# Patient Record
Sex: Male | Born: 1935 | Race: White | Hispanic: No | Smoking: Former smoker
Health system: Southern US, Community
[De-identification: ages and names within clinical notes are randomized; demographics above are authoritative.]

## PROBLEM LIST (undated history)

## (undated) ENCOUNTER — Emergency Department (HOSPITAL_COMMUNITY): Payer: Medicare Other | Source: Home / Self Care

## (undated) DIAGNOSIS — I444 Left anterior fascicular block: Secondary | ICD-10-CM

## (undated) DIAGNOSIS — H3530 Unspecified macular degeneration: Secondary | ICD-10-CM

## (undated) DIAGNOSIS — R531 Weakness: Secondary | ICD-10-CM

## (undated) DIAGNOSIS — Z8546 Personal history of malignant neoplasm of prostate: Secondary | ICD-10-CM

## (undated) DIAGNOSIS — K219 Gastro-esophageal reflux disease without esophagitis: Secondary | ICD-10-CM

## (undated) DIAGNOSIS — G309 Alzheimer's disease, unspecified: Secondary | ICD-10-CM

## (undated) DIAGNOSIS — E785 Hyperlipidemia, unspecified: Secondary | ICD-10-CM

## (undated) DIAGNOSIS — K56609 Unspecified intestinal obstruction, unspecified as to partial versus complete obstruction: Secondary | ICD-10-CM

## (undated) DIAGNOSIS — Z8719 Personal history of other diseases of the digestive system: Secondary | ICD-10-CM

## (undated) DIAGNOSIS — M810 Age-related osteoporosis without current pathological fracture: Secondary | ICD-10-CM

## (undated) DIAGNOSIS — R296 Repeated falls: Secondary | ICD-10-CM

## (undated) DIAGNOSIS — G301 Alzheimer's disease with late onset: Secondary | ICD-10-CM

## (undated) DIAGNOSIS — C801 Malignant (primary) neoplasm, unspecified: Secondary | ICD-10-CM

## (undated) DIAGNOSIS — F0280 Dementia in other diseases classified elsewhere without behavioral disturbance: Secondary | ICD-10-CM

## (undated) DIAGNOSIS — I10 Essential (primary) hypertension: Secondary | ICD-10-CM

## (undated) DIAGNOSIS — K625 Hemorrhage of anus and rectum: Secondary | ICD-10-CM

## (undated) DIAGNOSIS — M48061 Spinal stenosis, lumbar region without neurogenic claudication: Secondary | ICD-10-CM

## (undated) DIAGNOSIS — E039 Hypothyroidism, unspecified: Secondary | ICD-10-CM

## (undated) DIAGNOSIS — E079 Disorder of thyroid, unspecified: Secondary | ICD-10-CM

## (undated) HISTORY — PX: PROSTATECTOMY: SHX69

## (undated) HISTORY — DX: Gastro-esophageal reflux disease without esophagitis: K21.9

## (undated) HISTORY — PX: EYE SURGERY: SHX253

## (undated) HISTORY — DX: Essential (primary) hypertension: I10

## (undated) HISTORY — DX: Disorder of thyroid, unspecified: E07.9

## (undated) HISTORY — DX: Hyperlipidemia, unspecified: E78.5

## (undated) HISTORY — PX: OTHER SURGICAL HISTORY: SHX169

---

## 2013-11-17 DIAGNOSIS — Z8719 Personal history of other diseases of the digestive system: Secondary | ICD-10-CM

## 2013-11-17 DIAGNOSIS — K56609 Unspecified intestinal obstruction, unspecified as to partial versus complete obstruction: Secondary | ICD-10-CM

## 2013-11-17 HISTORY — DX: Unspecified intestinal obstruction, unspecified as to partial versus complete obstruction: K56.609

## 2013-11-17 HISTORY — DX: Personal history of other diseases of the digestive system: Z87.19

## 2014-08-08 DIAGNOSIS — R296 Repeated falls: Secondary | ICD-10-CM

## 2014-08-08 HISTORY — DX: Repeated falls: R29.6

## 2014-10-29 ENCOUNTER — Encounter: Payer: Self-pay | Admitting: Neurology

## 2014-11-01 ENCOUNTER — Other Ambulatory Visit: Payer: Self-pay | Admitting: *Deleted

## 2014-11-01 ENCOUNTER — Encounter: Payer: Self-pay | Admitting: Neurology

## 2014-11-01 ENCOUNTER — Ambulatory Visit (INDEPENDENT_AMBULATORY_CARE_PROVIDER_SITE_OTHER): Payer: Medicare Other | Admitting: Neurology

## 2014-11-01 VITALS — BP 128/68 | HR 70 | Resp 16 | Ht 67.0 in | Wt 168.9 lb

## 2014-11-01 DIAGNOSIS — M4806 Spinal stenosis, lumbar region: Secondary | ICD-10-CM | POA: Diagnosis not present

## 2014-11-01 DIAGNOSIS — R296 Repeated falls: Secondary | ICD-10-CM

## 2014-11-01 DIAGNOSIS — M48061 Spinal stenosis, lumbar region without neurogenic claudication: Secondary | ICD-10-CM

## 2014-11-01 DIAGNOSIS — F0390 Unspecified dementia without behavioral disturbance: Secondary | ICD-10-CM

## 2014-11-01 MED ORDER — DONEPEZIL HCL 5 MG PO TABS: 5.0000 mg | ORAL_TABLET | Freq: Every day | ORAL | Status: DC

## 2014-11-01 NOTE — Progress Notes (Signed)
NEUROLOGY CONSULTATION NOTE  Billy Banks MRN: [id number] DOB: October 16, 1922  Referring provider: Dr. Grandville Silos Primary care provider: Dr. Grandville Silos  Reason for consult:  Frequent falls  HISTORY OF PRESENT ILLNESS: Billy Banks is an 81 year old right-handed man with hypertension, hypothyroidism, dementia, and history of prostate cancer who presents for recurrent falls.  Records, labs, MRI of lumbar spine report, CT of head and cervical spine report reviewed.  He is accompanied by his daughter who provides some history.  For the last 2 years, he has had a gradual progression of recurrent falls.  He denies lightheadedness, vertigo, slurred speech, bowel or bladder incontinence, headache or visual disturbance.  When he is walking, he sometimes suddenly feels shaky and his legs give out.  He often falls to the left side.  It occurs sporadically.  He has chronic back pain but no pain down the legs.  He denies numbness in the feet.   CT of the head from 08/08/14 showed mild to moderate chronic small vessel disease without ventriculomegaly.    CT of cervical spine showed severe degenerative disc disease from C3-4 through C6-7 levels.  Most recently, he was in the hospital in February for recurrent falls.  Heart rate reportedly fluctuated.  EKG showed NSR with first degree heart block.  Ultimately, it was not found to be due to a cardiac etiology, orthostatic hypotension or hypoglycemia.  MRI of the lumbar spine performed in February showed multilevel spinal stenosis at L2-3 and L3-4.    Until recently, he was living by himself.  He has had falls where he was on the ground for up to 3 hours because there was nobody around.  He uses a walker but will forget to sit down in the seat when he begins to feel shaky.  He has since moved in with his daughter and he has not had falls.  For 2-3 hours, he may be home alone.  However, during this time, he remains in his small room where the toilet is just 4 or 5  steps away.    He has history of dementia.  He began to be forgetful a few years ago.  Doctors reportedly told him that he was having mini-strokes.  It appears to be a gradual decline.  He has word-finding difficulties.  When he has trouble communicating with others, he gets irritated and angry.  His daughter has to administer his medications.  He is resistant at times to bathe.  He uses the toilet by himself.  He sometimes has mild urinary incontinence.  He sleeps often during the day.  Sometimes when he wakes up in the middle of the night, he thinks it is daylight.  He forgets names of people.  He has mixed up his daughters.  He exhibits signs of executive dysfunction and dressing apraxia as well.  When he goes to appointments with his daughter, he is not sure where he is.  He had reduced appetite.  He was adopted, so family history of dementia is unknown.    Labs from January show mild anemia but otherwise CBC and CMP were unremarkable.  PAST MEDICAL HISTORY: Past Medical History  Diagnosis Date  . Hypertension   . Thyroid disease   . GERD (gastroesophageal reflux disease)   . Hyperlipidemia     PAST SURGICAL HISTORY: No past surgical history on file.  MEDICATIONS: Current Outpatient Prescriptions on File Prior to Visit  Medication Sig Dispense Refill  . acetaminophen (TYLENOL) 500 MG tablet Take  500 mg by mouth every 6 (six) hours as needed.    Marland Kitchen amLODipine (NORVASC) 5 MG tablet Take 5 mg by mouth daily.    Marland Kitchen aspirin 81 MG tablet Take 81 mg by mouth daily.    Marland Kitchen levothyroxine (SYNTHROID, LEVOTHROID) 50 MCG tablet Take 50 mcg by mouth daily before breakfast.    . loratadine (CLARITIN) 10 MG tablet Take 10 mg by mouth daily.    . meclizine (ANTIVERT) 25 MG tablet Take 25 mg by mouth 3 (three) times daily as needed for dizziness.    . Multiple Vitamin (MULTIVITAMIN) tablet Take 1 tablet by mouth daily.    . naproxen (NAPROSYN) 500 MG tablet Take 500 mg by mouth 2 (two) times daily with a  meal.    . Omega-3 Fatty Acids (FISH OIL PO) Take 1,000 mg by mouth daily.    Marland Kitchen omeprazole (PRILOSEC) 20 MG capsule Take 20 mg by mouth daily.    Marland Kitchen oxymetazoline (AFRIN) 0.05 % nasal spray Place 1 spray into both nostrils 2 (two) times daily.    . simvastatin (ZOCOR) 20 MG tablet Take 20 mg by mouth daily.    . diphenhydrAMINE (SOMINEX) 25 MG tablet Take 25 mg by mouth at bedtime as needed for sleep.     No current facility-administered medications on file prior to visit.    ALLERGIES: No Known Allergies  FAMILY HISTORY: Family History  Problem Relation Age of Onset  .       SOCIAL HISTORY: History   Social History  . Marital Status: Single    Spouse Name: N/A  . Number of Children: N/A  . Years of Education: N/A   Occupational History  . Not on file.   Social History Main Topics  . Smoking status: Former Research scientist (life sciences)  . Smokeless tobacco: Never Used  . Alcohol Use: No  . Drug Use: No  . Sexual Activity: No   Other Topics Concern  . Not on file   Social History Narrative    REVIEW OF SYSTEMS: Constitutional: No fevers, chills, or sweats, no generalized fatigue, change in appetite Eyes: No visual changes, double vision, eye pain Ear, nose and throat: No hearing loss, ear pain, nasal congestion, sore throat Cardiovascular: No chest pain, palpitations Respiratory:  No shortness of breath at rest or with exertion, wheezes GastrointestinaI: No nausea, vomiting, diarrhea, abdominal pain, fecal incontinence Genitourinary:  No dysuria, urinary retention or frequency Musculoskeletal:  No neck pain, back pain Integumentary: No rash, pruritus, skin lesions Neurological: as above Psychiatric: No depression, insomnia, anxiety Endocrine: No palpitations, fatigue, diaphoresis, mood swings, change in appetite, change in weight, increased thirst Hematologic/Lymphatic:  No anemia, purpura, petechiae. Allergic/Immunologic: no itchy/runny eyes, nasal congestion, recent allergic  reactions, rashes  PHYSICAL EXAM: Filed Vitals:   11/01/14 1044  BP: 128/68  Pulse: 70  Resp: 16   General: No acute distress Head:  Normocephalic/atraumatic Eyes:  fundi unremarkable, without vessel changes, exudates, hemorrhages or papilledema. Neck: supple, no paraspinal tenderness, full range of motion Back: No paraspinal tenderness Heart: regular rate and rhythm Lungs: Clear to auscultation bilaterally. Vascular: No carotid bruits. Neurological Exam: Mental status: alert and oriented to person, place, and time with minor deficits, delayed recall poor, remote memory intact, fund of knowledge intact, attention and concentration intact, speech fluent and not dysarthric, language intact. MMSE - Mini Mental State Exam 11/01/2014  Orientation to time 4  Orientation to Place 3  Registration 3  Attention/ Calculation 5  Recall 0  Language- name 2  objects 2  Language- repeat 0  Language- follow 3 step command 3  Language- read & follow direction 1  Write a sentence 1  Copy design 1  Total score 23    Cranial nerves: CN I: not tested CN II: pupils equal, round and reactive to light, visual fields intact, fundi unremarkable, without vessel changes, exudates, hemorrhages or papilledema. CN III, IV, VI:  full range of motion, no nystagmus, no ptosis CN V: facial sensation intact CN VII: upper and lower face symmetric CN VIII: hearing intact CN IX, X: gag intact, uvula midline CN XI: sternocleidomastoid and trapezius muscles intact CN XII: tongue midline Bulk & Tone: normal, no fasciculations. Motor:  5/5 throughout Sensation:  Reduced pinprick and vibration in feet. Deep Tendon Reflexes:  1+ throughout in upper extremities, absent in lower extremities, toes downgoing Finger to nose testing:  No dysmetria Gait:  Shuffling type gait.  Able to turn.  Unable to walk in tandem. Romberg negative.  IMPRESSION: Dementia without behavioral changes.  Possibly Alzheimer's given the  primarily memory problems and gradual progression. Frequent falls.  Probably related to dementia, complicated by neuropathy in feet. Lumbar spinal stenosis  PLAN: 1.  Will start Aricept 5mg  daily for 4 weeks.  If tolerated, will increase to 10mg  daily 2.  He must ambulate with a walker at all times and with somebody else with him.  If he is alone, it should be okay if he stays in his room where the toilet is just 4 steps away.  He should always have his life alert on him. 3.  Follow up in 6 months.  Thank you for allowing me to take part in the care of this patient.  Metta Clines, DO  CC:  Nicolette Bang, MD

## 2014-11-01 NOTE — Patient Instructions (Signed)
The falls is likely multi-factorial, but mostly I would attribute it to dementia 1.  We will start donepezil (Aricept) 5mg  daily for four weeks.  If you are tolerating the medication, then after four weeks, we will increase the dose to 10mg  daily.  Side effects include nausea, vomiting, diarrhea, vivid dreams, and muscle cramps.  Please call the clinic if you experience any of these symptoms. 2.  He must ambulate with a walker at all times and with somebody else with him.  If he is alone, it should be okay if he stays in his room where the toilet is just 4 steps away.  He should always have his life alert on him. 3.  Follow up in 6 months.

## 2014-11-04 ENCOUNTER — Telehealth: Payer: Self-pay | Admitting: Family Medicine

## 2014-11-04 ENCOUNTER — Telehealth: Payer: Self-pay | Admitting: Neurology

## 2014-11-04 MED ORDER — GALANTAMINE HYDROBROMIDE 4 MG PO TABS: 4.0000 mg | ORAL_TABLET | Freq: Two times a day (BID) | ORAL | Status: DC

## 2014-11-04 NOTE — Telephone Encounter (Signed)
Lmovm to return my call. 

## 2014-11-04 NOTE — Telephone Encounter (Signed)
I spoke with Billy Banks, she states that since patient started Aricept 5 mg on Monday he's had diarrhea. She is worried about him getting dehydrated. I did advise her to stop it for now. Do you want to try something else?

## 2014-11-04 NOTE — Telephone Encounter (Signed)
Benjamine Mola, pt's daughter called/returning your call at 10:26AM. C/b [phone]

## 2014-11-04 NOTE — Telephone Encounter (Signed)
Called Billy Banks and notified of med change & advisement. She will call in 4 weeks with an update & possibly dose increase.

## 2014-11-04 NOTE — Telephone Encounter (Signed)
Benjamine Mola, pt's daughter called wanting to speak to a nurse regarding his script for ARICEPT 5mg   C/b [phone]

## 2014-11-04 NOTE — Telephone Encounter (Signed)
[facility] medical records dept called wanting clarifications of what records you wanted. They received a record release from Susie on Wed, but it didn't specify what was being requested. They have records on patient that date back to 2003. Please clarify.

## 2014-11-04 NOTE — Telephone Encounter (Signed)
We can try galantamine 4mg  twice daily.  In 4 weeks, they should contact us and we can increase dose to 8mg  twice daily if tolerating.  Call sooner if side effects, which would be similar to Aricept.

## 2014-11-05 NOTE — Telephone Encounter (Signed)
Any notes by neurologist or pertaining to workup for dementia.  Any reports regarding MRI or CT of the brain.

## 2014-11-08 NOTE — Telephone Encounter (Signed)
Did try calling the number back that was given by the medical records clerk & it's not the correct number. Will have to wait for the records release is scanned into the patient's chart to get the correct number.

## 2014-11-24 ENCOUNTER — Telehealth: Payer: Self-pay | Admitting: Neurology

## 2014-11-24 NOTE — Telephone Encounter (Signed)
We can increase galantamine to 8mg  twice daily.

## 2014-11-24 NOTE — Telephone Encounter (Signed)
Please advise on below   Pt daughter Benjamine Mola called and wants to let us know patient is doing well on the glantanine and we can up the dose. Pt daughter phonenumber is [phone] please call her she would like to know about the dosage change if any

## 2014-11-24 NOTE — Telephone Encounter (Signed)
Pt daughter Benjamine Mola called and wants to let us know patient is doing well on the glantanine and we can up the dose. Pt daughter phonenumber is [phone] please call her she would like to know about the dosage change if any

## 2014-12-01 ENCOUNTER — Telehealth: Payer: Self-pay | Admitting: *Deleted

## 2014-12-01 ENCOUNTER — Telehealth: Payer: Self-pay | Admitting: Neurology

## 2014-12-01 NOTE — Telephone Encounter (Signed)
I have called this number [phone] it is not this patients number .Did not leave message

## 2014-12-01 NOTE — Telephone Encounter (Signed)
I left message for  Billy Banks to call office

## 2014-12-01 NOTE — Telephone Encounter (Signed)
Pt daughter Benjamine Mola called and wants to talk to someone about medication please call [phone]

## 2014-12-01 NOTE — Telephone Encounter (Signed)
Patient needs a Rx refill he also needs an increase in the RX  Call back [phone] Benjamine Mola)

## 2014-12-02 ENCOUNTER — Telehealth: Payer: Self-pay | Admitting: *Deleted

## 2014-12-02 NOTE — Telephone Encounter (Signed)
I have left several messages for this patients daughter Benjamine Mola to return my call  Regarding medication

## 2014-12-28 ENCOUNTER — Emergency Department (HOSPITAL_COMMUNITY)
Admission: EM | Admit: 2014-12-28 | Discharge: 2014-12-28 | Disposition: A | Discharge status: Home / Self Care | Payer: Medicare Other | Attending: Emergency Medicine | Admitting: Emergency Medicine

## 2014-12-28 ENCOUNTER — Encounter (HOSPITAL_COMMUNITY): Payer: Self-pay | Admitting: *Deleted

## 2014-12-28 DIAGNOSIS — R197 Diarrhea, unspecified: Secondary | ICD-10-CM | POA: Insufficient documentation

## 2014-12-28 DIAGNOSIS — G309 Alzheimer's disease, unspecified: Secondary | ICD-10-CM | POA: Diagnosis not present

## 2014-12-28 DIAGNOSIS — Z7982 Long term (current) use of aspirin: Secondary | ICD-10-CM | POA: Diagnosis not present

## 2014-12-28 DIAGNOSIS — Z79899 Other long term (current) drug therapy: Secondary | ICD-10-CM | POA: Insufficient documentation

## 2014-12-28 DIAGNOSIS — E079 Disorder of thyroid, unspecified: Secondary | ICD-10-CM | POA: Insufficient documentation

## 2014-12-28 DIAGNOSIS — Z8546 Personal history of malignant neoplasm of prostate: Secondary | ICD-10-CM | POA: Insufficient documentation

## 2014-12-28 DIAGNOSIS — I10 Essential (primary) hypertension: Secondary | ICD-10-CM | POA: Diagnosis not present

## 2014-12-28 HISTORY — DX: Unspecified macular degeneration: H35.30

## 2014-12-28 HISTORY — DX: Malignant (primary) neoplasm, unspecified: C80.1

## 2014-12-28 HISTORY — DX: Alzheimer's disease, unspecified: G30.9

## 2014-12-28 HISTORY — DX: Dementia in other diseases classified elsewhere, unspecified severity, without behavioral disturbance, psychotic disturbance, mood disturbance, and anxiety: F02.80

## 2014-12-28 LAB — COMPREHENSIVE METABOLIC PANEL
ALK PHOS: 73 U/L (ref 38–126)
ALT: 25 U/L (ref 17–63)
ANION GAP: 10 (ref 5–15)
AST: 34 U/L (ref 15–41)
Albumin: 3.7 g/dL (ref 3.5–5.0)
BILIRUBIN TOTAL: 0.7 mg/dL (ref 0.3–1.2)
BUN: 24 mg/dL — AB (ref 6–20)
CO2: 24 mmol/L (ref 22–32)
CREATININE: 1.19 mg/dL (ref 0.61–1.24)
Calcium: 8.8 mg/dL — ABNORMAL LOW (ref 8.9–10.3)
Chloride: 107 mmol/L (ref 101–111)
GFR calc non Af Amer: 52 mL/min — ABNORMAL LOW (ref >60)
Glucose, Bld: 100 mg/dL — ABNORMAL HIGH (ref 65–99)
POTASSIUM: 4.3 mmol/L (ref 3.5–5.1)
SODIUM: 141 mmol/L (ref 135–145)
Total Protein: 6.8 g/dL (ref 6.5–8.1)

## 2014-12-28 LAB — CBC WITH DIFFERENTIAL/PLATELET
BASOS ABS: 0.1 10*3/uL (ref 0.0–0.1)
BASOS PCT: 1 % (ref 0–1)
EOS ABS: 0.1 10*3/uL (ref 0.0–0.7)
EOS PCT: 1 % (ref 0–5)
HCT: 48 % (ref 39.0–52.0)
Hemoglobin: 15.3 g/dL (ref 13.0–17.0)
LYMPHS ABS: 2 10*3/uL (ref 0.7–4.0)
LYMPHS PCT: 19 % (ref 12–46)
MCH: 30.3 pg (ref 26.0–34.0)
MCHC: 31.9 g/dL (ref 30.0–36.0)
MCV: 95 fL (ref 78.0–100.0)
MONO ABS: 1 10*3/uL (ref 0.1–1.0)
MONOS PCT: 10 % (ref 3–12)
Neutro Abs: 7.3 10*3/uL (ref 1.7–7.7)
Neutrophils Relative %: 69 % (ref 43–77)
Platelets: 428 10*3/uL — ABNORMAL HIGH (ref 150–400)
RBC: 5.05 MIL/uL (ref 4.22–5.81)
RDW: 15.5 % (ref 11.5–15.5)
WBC: 10.4 10*3/uL (ref 4.0–10.5)

## 2014-12-28 LAB — POC OCCULT BLOOD, ED: Fecal Occult Bld: NEGATIVE

## 2014-12-28 NOTE — Discharge Instructions (Signed)
If you were given medicines take as directed.  If you are on coumadin or contraceptives realize their levels and effectiveness is altered by many different medicines.  If you have any reaction (rash, tongues swelling, other) to the medicines stop taking and see a physician.    If your blood pressure was elevated in the ER make sure you follow up for management with a primary doctor or return for chest pain, shortness of breath or stroke symptoms.  Please follow up as directed and return to the ER or see a physician for new or worsening symptoms.  Thank you. Filed Vitals:   12/28/14 1112 12/28/14 1259  BP: 148/72 142/71  Pulse: 83 79  Temp: 98 F (36.7 C) 98 F (36.7 C)  TempSrc: Oral Oral  Resp: 16 17  SpO2: 96% 97%

## 2014-12-28 NOTE — ED Notes (Signed)
Pt reports diarrhea since 0900 today.  Reports "black" stool.  Pt's daughter reports episode of urinary incontinence recently which is not normal for him.  Pt with hx of alzheimer's.

## 2014-12-28 NOTE — ED Provider Notes (Signed)
CSN: [id number]     Arrival date & time 12/28/14  1041 History   First MD Initiated Contact with Patient 12/28/14 1200     Chief Complaint  Patient presents with  . Diarrhea     (Consider location/radiation/quality/duration/timing/severity/associated sxs/prior Treatment) HPI Comments: 81 year old male with history of Alzheimer's, high blood pressure, lives with daughter for full care presents with recurrent diarrhea since this morning black color proximate 8-9 episodes. No further episodes while waiting in the ER. No fevers chills or vomiting. No C. difficile history however patient recently was in a nursing care facility well family is out of town. No abdominal pain.  Symptoms improved with time.  Patient is a 81 y.o. male presenting with diarrhea. The history is provided by the patient.  Diarrhea Associated symptoms: no abdominal pain, no chills, no fever, no headaches and no vomiting     Past Medical History  Diagnosis Date  . Alzheimer disease   . Macular degeneration   . Cancer     prostate  . Hypertension   . Thyroid disease    Past Surgical History  Procedure Laterality Date  . Prostatectomy     No family history on file. History  Substance Use Topics  . Smoking status: Never Smoker   . Smokeless tobacco: Not on file  . Alcohol Use: No    Review of Systems  Constitutional: Negative for fever and chills.  HENT: Negative for congestion.   Eyes: Negative for visual disturbance.  Respiratory: Negative for shortness of breath.   Cardiovascular: Negative for chest pain.  Gastrointestinal: Positive for diarrhea. Negative for vomiting and abdominal pain.  Genitourinary: Negative for dysuria and flank pain.  Musculoskeletal: Negative for back pain, neck pain and neck stiffness.  Skin: Negative for rash.  Neurological: Negative for light-headedness and headaches.      Allergies  Review of patient's allergies indicates no known allergies.  Home Medications   Prior  to Admission medications   Medication Sig Start Date End Date Taking? Authorizing Provider  amLODipine (NORVASC) 10 MG tablet Take 5 mg by mouth daily.   Yes Historical Provider, MD  aspirin EC 81 MG tablet Take 81 mg by mouth daily.   Yes Historical Provider, MD  ferrous sulfate 325 (65 FE) MG tablet Take 325 mg by mouth daily with breakfast.   Yes Historical Provider, MD  galantamine (RAZADYNE) 4 MG tablet Take 4 mg by mouth 2 (two) times daily with a meal.   Yes Historical Provider, MD  levothyroxine (SYNTHROID, LEVOTHROID) 88 MCG tablet Take 88 mcg by mouth daily before breakfast.   Yes Historical Provider, MD  loratadine (CLARITIN) 10 MG tablet Take 10 mg by mouth at bedtime.   Yes Historical Provider, MD  Multiple Vitamins-Minerals (PRESERVISION AREDS 2 PO) Take 1 tablet by mouth 2 (two) times daily.   Yes Historical Provider, MD  naproxen (NAPROSYN) 500 MG tablet Take 500 mg by mouth 2 (two) times daily as needed for moderate pain (with food).   Yes Historical Provider, MD  Omega-3 Fatty Acids (FISH OIL) 1000 MG CAPS Take 1 capsule by mouth 2 (two) times daily.   Yes Historical Provider, MD  omeprazole (PRILOSEC) 20 MG capsule Take 20 mg by mouth daily.   Yes Historical Provider, MD  simvastatin (ZOCOR) 40 MG tablet Take 20 mg by mouth daily.   Yes Historical Provider, MD   BP 142/71 mmHg  Pulse 79  Temp(Src) 98 F (36.7 C) (Oral)  Resp 17  SpO2 97% Physical  Exam  Constitutional: He is oriented to person, place, and time. He appears well-developed and well-nourished.  HENT:  Head: Normocephalic and atraumatic.  Eyes: Conjunctivae are normal. Right eye exhibits no discharge. Left eye exhibits no discharge.  Neck: Normal range of motion. Neck supple. No tracheal deviation present.  Cardiovascular: Normal rate and regular rhythm.   Pulmonary/Chest: Effort normal and breath sounds normal.  Abdominal: Soft. He exhibits no distension. There is no tenderness. There is no guarding.   Musculoskeletal: He exhibits no edema.  Neurological: He is alert and oriented to person, place, and time.  Skin: Skin is warm. No rash noted.  Psychiatric: He has a normal mood and affect.  Nursing note and vitals reviewed.   ED Course  Procedures (including critical care time) Labs Review Labs Reviewed  CBC WITH DIFFERENTIAL/PLATELET - Abnormal; Notable for the following:    Platelets 428 (*)    All other components within normal limits  COMPREHENSIVE METABOLIC PANEL - Abnormal; Notable for the following:    Glucose, Bld 100 (*)    BUN 24 (*)    Calcium 8.8 (*)    GFR calc non Af Amer 52 (*)    All other components within normal limits  CLOSTRIDIUM DIFFICILE BY PCR (NOT AT [facility])  POC OCCULT BLOOD, ED    Imaging Review No results found.   EKG Interpretation None      MDM   Final diagnoses:  Diarrhea   Well-appearing patient presents with recurrent diarrhea. Normal vitals, blood work unremarkable. Patient did not have any further diarrhea episodes in the ED, C. difficile ordered. Patient stable for outpatient follow-up.  Results and differential diagnosis were discussed with the patient/parent/guardian. Close follow up outpatient was discussed, comfortable with the plan.   Medications - No data to display  Filed Vitals:   12/28/14 1112 12/28/14 1259 12/28/14 1516  BP: 148/72 142/71 138/78  Pulse: 83 79 77  Temp: 98 F (36.7 C) 98 F (36.7 C) 98 F (36.7 C)  TempSrc: Oral Oral   Resp: 16 17 18   SpO2: 96% 97% 98%    Final diagnoses:  Diarrhea       Elnora Morrison, MD 12/28/14 1517

## 2014-12-29 ENCOUNTER — Encounter: Payer: Self-pay | Admitting: Neurology

## 2015-02-08 ENCOUNTER — Other Ambulatory Visit: Payer: Self-pay | Admitting: Urology

## 2015-02-08 DIAGNOSIS — C61 Malignant neoplasm of prostate: Secondary | ICD-10-CM

## 2015-02-09 ENCOUNTER — Other Ambulatory Visit: Payer: Self-pay | Admitting: Urology

## 2015-02-09 DIAGNOSIS — M8580 Other specified disorders of bone density and structure, unspecified site: Secondary | ICD-10-CM

## 2015-02-16 ENCOUNTER — Other Ambulatory Visit: Payer: Self-pay

## 2015-02-21 ENCOUNTER — Ambulatory Visit
Admission: RE | Admit: 2015-02-21 | Discharge: 2015-02-21 | Disposition: A | Discharge status: Home / Self Care | Payer: Medicare Other | Source: Ambulatory Visit | Attending: Urology | Admitting: Urology

## 2015-02-21 DIAGNOSIS — M8580 Other specified disorders of bone density and structure, unspecified site: Secondary | ICD-10-CM

## 2015-03-08 ENCOUNTER — Telehealth: Payer: Self-pay | Admitting: Neurology

## 2015-03-08 NOTE — Telephone Encounter (Signed)
Sent disc to canopy to have uploaded to epic

## 2015-05-02 ENCOUNTER — Ambulatory Visit (INDEPENDENT_AMBULATORY_CARE_PROVIDER_SITE_OTHER): Payer: Medicare Other | Admitting: Neurology

## 2015-05-02 ENCOUNTER — Encounter: Payer: Self-pay | Admitting: Neurology

## 2015-05-02 VITALS — BP 152/82 | HR 78 | Ht 67.0 in | Wt 172.0 lb

## 2015-05-02 DIAGNOSIS — I10 Essential (primary) hypertension: Secondary | ICD-10-CM

## 2015-05-02 DIAGNOSIS — G301 Alzheimer's disease with late onset: Secondary | ICD-10-CM

## 2015-05-02 DIAGNOSIS — F0280 Dementia in other diseases classified elsewhere without behavioral disturbance: Secondary | ICD-10-CM

## 2015-05-02 HISTORY — DX: Alzheimer's disease with late onset: G30.1

## 2015-05-02 HISTORY — DX: Essential (primary) hypertension: I10

## 2015-05-02 HISTORY — DX: Dementia in other diseases classified elsewhere, unspecified severity, without behavioral disturbance, psychotic disturbance, mood disturbance, and anxiety: F02.80

## 2015-05-02 MED ORDER — GALANTAMINE HYDROBROMIDE 8 MG PO TABS: 8.0000 mg | ORAL_TABLET | Freq: Two times a day (BID) | ORAL | Status: DC

## 2015-05-02 NOTE — Progress Notes (Signed)
NEUROLOGY FOLLOW UP OFFICE NOTE  Billy Banks [id number]  HISTORY OF PRESENT ILLNESS: Billy Banks is an 81 year old right-handed man with hypertension, hypothyroidism, dementia, lumbar stenosis and history of prostate cancer who follows up for dementia and falls.  He is accompanied by his daughter who provides some history.  UPDATE: He was started on Aricept in April, but soon developed diarrhea.  He was switched to galantamine, and diarrhea seemed to have improved.  However, it recurred, requiring an ED visit in June.  He has since been doing well.  He goes to the [place] 4 times a week, where he routinely exercises and engages in social activities.  He occasionally has bowel or bladder incontinence.  He feels his memory has gotten worse.  He was unable to recognize family members at a reunion.  Sometimes dresses himself incorrectly.  He bathes with assistance at the [place].  HISTORY: For the last 2.5 years, he has had a gradual progression of recurrent falls.  He denies lightheadedness, vertigo, slurred speech, bowel or bladder incontinence, headache or visual disturbance.  When he is walking, he sometimes suddenly feels shaky and his legs give out.  He often falls to the left side.  It occurs sporadically.  He has chronic back pain but no pain down the legs.  He denies numbness in the feet.   CT of the head from 08/08/14 showed mild to moderate chronic small vessel disease without ventriculomegaly.    CT of cervical spine showed severe degenerative disc disease from C3-4 through C6-7 levels.  Most recently, he was in the hospital in February for recurrent falls.  Heart rate reportedly fluctuated.  EKG showed NSR with first degree heart block.  Ultimately, it was not found to be due to a cardiac etiology, orthostatic hypotension or hypoglycemia.  MRI of the lumbar spine performed in February showed multilevel spinal stenosis at L2-3 and L3-4.    He has had falls where he was  on the ground for up to 3 hours because there was nobody around.  He uses a walker but will forget to sit down in the seat when he begins to feel shaky.  He has since moved in with his daughter and he has not had falls.  For 2-3 hours, he may be home alone.  However, during this time, he remains in his small room where the toilet is just 4 or 5 steps away.    He lives with his daughter.  He began to be forgetful a few years ago.  Doctors reportedly told him that he was having mini-strokes.  It appears to be a gradual decline.  He has word-finding difficulties.  When he has trouble communicating with others, he gets irritated and angry.  His daughter has to administer his medications.  He is resistant at times to bathe.  He uses the toilet by himself.  He sometimes has mild urinary incontinence.  He sleeps often during the day.  Sometimes when he wakes up in the middle of the night, he thinks it is daylight.  He forgets names of people.  He has mixed up his daughters.  He exhibits signs of executive dysfunction and dressing apraxia as well.  When he goes to appointments with his daughter, he is not sure where he is.  He had reduced appetite.  He was adopted, so family history of dementia is unknown.  PAST MEDICAL HISTORY: Past Medical History  Diagnosis Date  . GERD (gastroesophageal reflux  disease)   . Hyperlipidemia   . Alzheimer disease   . Macular degeneration   . Cancer [facility])     prostate  . Hypertension   . Thyroid disease     MEDICATIONS: Current Outpatient Prescriptions on File Prior to Visit  Medication Sig Dispense Refill  . amLODipine (NORVASC) 5 MG tablet Take 5 mg by mouth daily.    Marland Kitchen aspirin 81 MG tablet Take 81 mg by mouth daily.    Marland Kitchen loratadine (CLARITIN) 10 MG tablet Take 10 mg by mouth at bedtime.    . Multiple Vitamins-Minerals (PRESERVISION AREDS 2 PO) Take 1 tablet by mouth 2 (two) times daily.    . naproxen (NAPROSYN) 500 MG tablet Take 500 mg by mouth 2 (two) times daily  with a meal.    . Omega-3 Fatty Acids (FISH OIL PO) Take 1,000 mg by mouth daily.    Marland Kitchen omeprazole (PRILOSEC) 20 MG capsule Take 20 mg by mouth daily.    . simvastatin (ZOCOR) 20 MG tablet Take 20 mg by mouth daily.     No current facility-administered medications on file prior to visit.    ALLERGIES: No Known Allergies  FAMILY HISTORY: Family History  Problem Relation Age of Onset  .       SOCIAL HISTORY: Social History   Social History  . Marital Status: Widowed    Spouse Name: N/A  . Number of Children: N/A  . Years of Education: N/A   Occupational History  . Not on file.   Social History Main Topics  . Smoking status: Never Smoker   . Smokeless tobacco: Not on file  . Alcohol Use: No  . Drug Use: No  . Sexual Activity: No   Other Topics Concern  . Not on file   Social History Narrative   ** Merged History Encounter **        REVIEW OF SYSTEMS: Constitutional: No fevers, chills, or sweats, no generalized fatigue, change in appetite Eyes: No visual changes, double vision, eye pain Ear, nose and throat: No hearing loss, ear pain, nasal congestion, sore throat Cardiovascular: No chest pain, palpitations Respiratory:  No shortness of breath at rest or with exertion, wheezes GastrointestinaI: No nausea, vomiting, diarrhea, abdominal pain, fecal incontinence Genitourinary:  No dysuria, urinary retention or frequency Musculoskeletal:  No neck pain, back pain Integumentary: No rash, pruritus, skin lesions Neurological: as above Psychiatric: No depression, insomnia, anxiety Endocrine: No palpitations, fatigue, diaphoresis, mood swings, change in appetite, change in weight, increased thirst Hematologic/Lymphatic:  No anemia, purpura, petechiae. Allergic/Immunologic: no itchy/runny eyes, nasal congestion, recent allergic reactions, rashes  PHYSICAL EXAM: Filed Vitals:   05/02/15 1358  BP: 152/82  Pulse: 78   General: No acute distress.  Patient appears  well-groomed.   Head:  Normocephalic/atraumatic Eyes:  Fundoscopic exam unremarkable without vessel changes, exudates, hemorrhages or papilledema. Neck: supple, no paraspinal tenderness, full range of motion Heart:  Regular rate and rhythm Lungs:  Clear to auscultation bilaterally Back: No paraspinal tenderness Neurological Exam: alert and oriented to person, place, and time with minor deficits, delayed recall poor, remote memory intact, fund of knowledge intact, attention and concentration intact, speech fluent and not dysarthric, language intact. MMSE - Mini Mental State Exam 05/02/2015 11/01/2014  Orientation to time 4 4  Orientation to Place 4 3  Registration 3 3  Attention/ Calculation 1 5  Recall 3 0  Language- name 2 objects 2 2  Language- repeat 1 0  Language- follow 3 step command 3  3  Language- read & follow direction 1 1  Write a sentence 1 1  Copy design 1 1  Total score 24 23   CN II-XII intact.  Bulk and tone normal.  Muscle strength 5/5 throughout.  Sensation to light touch intact.  Deep tendon reflexes 1+ in upper extremities and absent in lower extremities.  Finger to nose intact.  Unable to walk without assistance.  IMPRESSION: Alzheimer's disease Hypertension  PLAN: 1.  Increase galantamine to 8mg  twice daily and have patient or daughter call in 4 weeks with update.  We can increase dose to 12mg  twice daily if tolerating. 2.  Continue activities at [place] 3.  Recheck BP with PCP. 4.  Follow up in 6 months.  28 minutes spent face to face with patient, over 50% spent discussing management.  Metta Clines, DO  CC:  Nicolette Bang

## 2015-05-02 NOTE — Patient Instructions (Signed)
Increase galantamine to 8mg  twice daily.  Call in 4 weeks with update and we can increase dose to 12mg  twice daily if tolerating. Recheck blood pressure with PCP Follow up in 6 months.

## 2015-06-13 ENCOUNTER — Telehealth: Payer: Self-pay

## 2015-06-13 MED ORDER — GALANTAMINE HYDROBROMIDE 12 MG PO TABS: 12.0000 mg | ORAL_TABLET | Freq: Two times a day (BID) | ORAL | Status: DC

## 2015-06-13 NOTE — Telephone Encounter (Signed)
Increase galantamine to 12mg  twice daily.

## 2015-06-13 NOTE — Telephone Encounter (Signed)
Daughter called for refill on galantamine. States medication is helping. Daughter unsure if you wanted to up the dosage to 12 mg. Please advise.

## 2015-06-13 NOTE — Telephone Encounter (Signed)
Medication refilled. Pt aware

## 2015-06-18 ENCOUNTER — Other Ambulatory Visit: Payer: Self-pay | Admitting: Neurology

## 2015-06-20 ENCOUNTER — Telehealth: Payer: Self-pay | Admitting: Neurology

## 2015-06-20 NOTE — Telephone Encounter (Signed)
Medication sent in last week. Verified with pharmacy. Daughter aware.

## 2015-06-20 NOTE — Telephone Encounter (Signed)
PT's daughter Benjamine Mola called and needed a prescription for his alzheimer's called in/Dawn CB# [phone]

## 2015-06-23 ENCOUNTER — Encounter (HOSPITAL_COMMUNITY): Payer: Self-pay

## 2015-06-23 ENCOUNTER — Inpatient Hospital Stay (HOSPITAL_COMMUNITY)
Admission: EM | Admit: 2015-06-23 | Discharge: 2015-06-24 | DRG: 378 | Disposition: A | Discharge status: Home / Self Care | Payer: Medicare Other | Attending: Family Medicine | Admitting: Family Medicine

## 2015-06-23 ENCOUNTER — Emergency Department (HOSPITAL_COMMUNITY): Payer: Medicare Other

## 2015-06-23 DIAGNOSIS — E785 Hyperlipidemia, unspecified: Secondary | ICD-10-CM | POA: Diagnosis not present

## 2015-06-23 DIAGNOSIS — R32 Unspecified urinary incontinence: Secondary | ICD-10-CM | POA: Diagnosis not present

## 2015-06-23 DIAGNOSIS — G629 Polyneuropathy, unspecified: Secondary | ICD-10-CM | POA: Diagnosis not present

## 2015-06-23 DIAGNOSIS — K625 Hemorrhage of anus and rectum: Secondary | ICD-10-CM

## 2015-06-23 DIAGNOSIS — G301 Alzheimer's disease with late onset: Secondary | ICD-10-CM | POA: Diagnosis not present

## 2015-06-23 DIAGNOSIS — Z7982 Long term (current) use of aspirin: Secondary | ICD-10-CM

## 2015-06-23 DIAGNOSIS — Z791 Long term (current) use of non-steroidal anti-inflammatories (NSAID): Secondary | ICD-10-CM | POA: Diagnosis not present

## 2015-06-23 DIAGNOSIS — I10 Essential (primary) hypertension: Secondary | ICD-10-CM | POA: Diagnosis not present

## 2015-06-23 DIAGNOSIS — F0280 Dementia in other diseases classified elsewhere without behavioral disturbance: Secondary | ICD-10-CM | POA: Diagnosis not present

## 2015-06-23 DIAGNOSIS — M810 Age-related osteoporosis without current pathological fracture: Secondary | ICD-10-CM | POA: Diagnosis not present

## 2015-06-23 DIAGNOSIS — Z79899 Other long term (current) drug therapy: Secondary | ICD-10-CM | POA: Diagnosis not present

## 2015-06-23 DIAGNOSIS — R531 Weakness: Secondary | ICD-10-CM

## 2015-06-23 DIAGNOSIS — R195 Other fecal abnormalities: Secondary | ICD-10-CM | POA: Diagnosis present

## 2015-06-23 DIAGNOSIS — N179 Acute kidney failure, unspecified: Secondary | ICD-10-CM | POA: Diagnosis present

## 2015-06-23 DIAGNOSIS — K5900 Constipation, unspecified: Secondary | ICD-10-CM | POA: Diagnosis not present

## 2015-06-23 DIAGNOSIS — E079 Disorder of thyroid, unspecified: Secondary | ICD-10-CM | POA: Diagnosis present

## 2015-06-23 DIAGNOSIS — H3530 Unspecified macular degeneration: Secondary | ICD-10-CM | POA: Diagnosis not present

## 2015-06-23 DIAGNOSIS — K219 Gastro-esophageal reflux disease without esophagitis: Secondary | ICD-10-CM | POA: Diagnosis not present

## 2015-06-23 DIAGNOSIS — Z8546 Personal history of malignant neoplasm of prostate: Secondary | ICD-10-CM | POA: Diagnosis not present

## 2015-06-23 DIAGNOSIS — M48061 Spinal stenosis, lumbar region without neurogenic claudication: Secondary | ICD-10-CM | POA: Diagnosis present

## 2015-06-23 DIAGNOSIS — Z9079 Acquired absence of other genital organ(s): Secondary | ICD-10-CM | POA: Diagnosis not present

## 2015-06-23 DIAGNOSIS — Z87891 Personal history of nicotine dependence: Secondary | ICD-10-CM | POA: Diagnosis not present

## 2015-06-23 HISTORY — DX: Dementia in other diseases classified elsewhere without behavioral disturbance: F02.80

## 2015-06-23 HISTORY — DX: Unspecified intestinal obstruction, unspecified as to partial versus complete obstruction: K56.609

## 2015-06-23 HISTORY — DX: Weakness: R53.1

## 2015-06-23 HISTORY — DX: Left anterior fascicular block: I44.4

## 2015-06-23 HISTORY — DX: Repeated falls: R29.6

## 2015-06-23 HISTORY — DX: Personal history of malignant neoplasm of prostate: Z85.46

## 2015-06-23 HISTORY — DX: Hypothyroidism, unspecified: E03.9

## 2015-06-23 HISTORY — DX: Age-related osteoporosis without current pathological fracture: M81.0

## 2015-06-23 HISTORY — DX: Hemorrhage of anus and rectum: K62.5

## 2015-06-23 HISTORY — DX: Essential (primary) hypertension: I10

## 2015-06-23 HISTORY — DX: Alzheimer's disease with late onset: G30.1

## 2015-06-23 HISTORY — DX: Personal history of other diseases of the digestive system: Z87.19

## 2015-06-23 HISTORY — DX: Hyperlipidemia, unspecified: E78.5

## 2015-06-23 HISTORY — DX: Spinal stenosis, lumbar region without neurogenic claudication: M48.061

## 2015-06-23 LAB — COMPREHENSIVE METABOLIC PANEL
ALBUMIN: 3.3 g/dL — AB (ref 3.5–5.0)
ALK PHOS: 66 U/L (ref 38–126)
ALT: 23 U/L (ref 17–63)
AST: 24 U/L (ref 15–41)
Anion gap: 9 (ref 5–15)
BUN: 30 mg/dL — ABNORMAL HIGH (ref 6–20)
CALCIUM: 8.6 mg/dL — AB (ref 8.9–10.3)
CO2: 23 mmol/L (ref 22–32)
CREATININE: 1.25 mg/dL — AB (ref 0.61–1.24)
Chloride: 108 mmol/L (ref 101–111)
GFR calc Af Amer: 57 mL/min — ABNORMAL LOW (ref >60)
GFR calc non Af Amer: 49 mL/min — ABNORMAL LOW (ref >60)
GLUCOSE: 103 mg/dL — AB (ref 65–99)
Potassium: 4.5 mmol/L (ref 3.5–5.1)
SODIUM: 140 mmol/L (ref 135–145)
Total Bilirubin: 0.7 mg/dL (ref 0.3–1.2)
Total Protein: 6.1 g/dL — ABNORMAL LOW (ref 6.5–8.1)

## 2015-06-23 LAB — TYPE AND SCREEN
ABO/RH(D): A POS
Antibody Screen: NEGATIVE

## 2015-06-23 LAB — CBC
HEMATOCRIT: 50 % (ref 39.0–52.0)
Hemoglobin: 15.1 g/dL (ref 13.0–17.0)
MCH: 24.4 pg — ABNORMAL LOW (ref 26.0–34.0)
MCHC: 30.2 g/dL (ref 30.0–36.0)
MCV: 80.8 fL (ref 78.0–100.0)
Platelets: 546 10*3/uL — ABNORMAL HIGH (ref 150–400)
RBC: 6.19 MIL/uL — ABNORMAL HIGH (ref 4.22–5.81)
RDW: 17.9 % — AB (ref 11.5–15.5)
WBC: 13.2 10*3/uL — ABNORMAL HIGH (ref 4.0–10.5)

## 2015-06-23 LAB — URINALYSIS, ROUTINE W REFLEX MICROSCOPIC
Bilirubin Urine: NEGATIVE
Glucose, UA: NEGATIVE mg/dL
Hgb urine dipstick: NEGATIVE
Ketones, ur: NEGATIVE mg/dL
LEUKOCYTES UA: NEGATIVE
NITRITE: NEGATIVE
PH: 5 (ref 5.0–8.0)
Protein, ur: NEGATIVE mg/dL
SPECIFIC GRAVITY, URINE: 1.014 (ref 1.005–1.030)

## 2015-06-23 LAB — POC OCCULT BLOOD, ED: Fecal Occult Bld: POSITIVE — AB

## 2015-06-23 LAB — ABO/RH: ABO/RH(D): A POS

## 2015-06-23 NOTE — H&P (Signed)
[facility] Admission History and Physical Service Pager: [phone]  Patient name: Billy Banks Medical record number: [id number] Date of birth: 25-Jan-1923 Age: 81 y.o. Gender: male  Primary Care Provider: Dwaine Deter, MD; receives care at [facility] Consultants: None Code Status: Full  Chief Complaint: Blood in Stools  Assessment and Plan: Billy Banks is a 81 y.o. male presenting with blood in stools and weakness. PMH is significant for history of Alzheimer's Dementia, prostate CA s/p prostatectomy, HTN, hyperlipidemia, and thyroid disease.  #Rectal Bleeding: BRBPR x1 today. Episode not witnessed and patient has some trouble with details from event. Happened once before when had bowel blockage per daughter last year. No further episodes since earlier today. Hemodynamically stable. Hemoglobin 15; at baseline. On DRE no blood on glove; in ED found to have grossly bloody digital rectal exam. No visible hemorrhoids or fissure; however patient was straining with episode and large stool. Most likely cause of bleed is due to constipation causing bleeding. Not on blood thinners. -admit to med-surg for observation under Dr. McDiarmid -repeat CBC in AM -stool softener started -PT-INR ordered -no need for GI consult at this time; consider if bleeding worsens  #AKI: Unsure of baseline renal function. No known CKD. BUN/Cr ratio >20 indicating a prerenal source. Daughter does state he has not been hydrating well. -repeat labs in morning -monitor I/O -500cc bolus given   #Urinary Incontinence: One episode today. Has never been an issue before. UA was normal. Patient s/p prostatectomy.  -continue to monitor -monitor I/O  #Weakness: No weakness appreciated on exam. Does receive intense physical therapy at day center.  -PT/OT consult   #HTN: BP at goal for age; <150/90.  -continue home amlodipine 5mg   #Alzheimers dementia: Recently diagnosed about  8 months ago. Started taking galantamine without signs of rapid decline of dementia. Patient A&Ox1 on exam.  -continue galantimine -consider MMSE for baseline cognition  -monitor for signs of delirium -orient daily  #HLD: Unsure of baseline. -Continue statin -lipid panel ordered  #Thyroid disease: No TSH levels in chart.  -TSH ordered -continue current dose of levothyroxine  FEN/GI: heart healthy diet, continue home PPI, Saline lock IV Prophylaxis: Due to bleed hold off on pharmocological VTE prophylaxis; SCDs ordered  Disposition: Admit for observation due to weakness and GI bleed  History of Present Illness:  Billy Banks is a 81 y.o. male presenting with blood ins tools x1 episode today. Of note, patient has dementia so history obtained from patient and daughter. Daughter states that she received a call from adult day center that patient attends that he reported blood with stooling. Episode was not witnessed by staff. Patient states he had one stool and that upon wiping their were two spots of bright red blood on tissue. He endorses some straining with bowel movement and passing large stool. Patient states he does not remember any blood in toilet and that his stool seemed of normal color (again history limited as daughter says earlier patient could not remember these details). Daughter states that last episode of bleeding per rectum was last year when patient had bowel blockage. No known history of hemorrhoids.   Also of note daughter concerned about patient's worsening weakness and confusion. She stated that usually he walks with a walker and can do well. However lately he is only able to walk a few feet and is shaky. He then has to sit and rest on walker. Having to go shorter distances than usual. He does have  neuropathy in his legs. He moved in with his daughter in February because he was having recurrent falls at home. He has not had any falls since living with her. She states his  dementia seems to be getting worse this week. His since of time is skewed. She usually is able to orient him and he remembers but not been helpful this last week. Had episode of urinary incontinence x1 today this has never happened before.   Review Of Systems: Per HPI with the following additions: no fevers, CP, SOB, abdominal pain, nausea, vomiting. Otherwise the remainder of the systems were negative.  Patient Active Problem List   Diagnosis Date Noted  . Late onset Alzheimer's disease without behavioral disturbance 05/02/2015  . Essential hypertension 05/02/2015    Past Medical History: Past Medical History  Diagnosis Date  . GERD (gastroesophageal reflux disease)   . Hyperlipidemia   . Alzheimer disease   . Macular degeneration   . Cancer [facility])     prostate  . Hypertension   . Thyroid disease     Past Surgical History: Past Surgical History  Procedure Laterality Date  . Prostatectomy      Social History: Former smoker; now quit for over 20 years Smoker heavy prior for about 65yrs No alcohol use Additional social history: Lives with daughter since February Please also refer to relevant sections of EMR.  Family History: Family History  Problem Relation Age of Onset  .      Allergies and Medications: No Known Allergies No current facility-administered medications on file prior to encounter.   Current Outpatient Prescriptions on File Prior to Encounter  Medication Sig Dispense Refill  . amLODipine (NORVASC) 5 MG tablet Take 5 mg by mouth daily.    Marland Kitchen aspirin 81 MG tablet Take 81 mg by mouth daily.    . Calcium Carb-Cholecalciferol (CALCIUM 600 + D PO) Take 1 tablet by mouth 2 (two) times daily.     . Cholecalciferol (D3 ADULT PO) Take 1 tablet by mouth daily.     Marland Kitchen galantamine (RAZADYNE) 12 MG tablet Take 1 tablet (12 mg total) by mouth 2 (two) times daily. 60 tablet 3  . levothyroxine (SYNTHROID, LEVOTHROID) 88 MCG tablet Take 88 mcg by mouth daily before  breakfast.    . loratadine (CLARITIN) 10 MG tablet Take 10 mg by mouth daily.     . Multiple Vitamins-Minerals (PRESERVISION AREDS 2 PO) Take 1 tablet by mouth 2 (two) times daily.    . naproxen (NAPROSYN) 500 MG tablet Take 500 mg by mouth 2 (two) times daily with a meal.    . Omega-3 Fatty Acids (FISH OIL PO) Take 1,000 mg by mouth 2 (two) times daily.     Marland Kitchen omeprazole (PRILOSEC) 20 MG capsule Take 20 mg by mouth daily.    . simvastatin (ZOCOR) 20 MG tablet Take 20 mg by mouth daily.      Objective: BP 138/78 mmHg  Pulse 71  Temp(Src) 98.1 F (36.7 C) (Oral)  Resp 21  SpO2 94% Exam: General: 81yo WM lying in bed, alert, well-developed, NAD, cooperative HEET: NCAT, vision grossly intact, PERRLA, no injection and anicteric. EOMI. MMM, oral mucosa and oropharynx reveal no lesions or exudates. External ear exam reveals no significant lesions or deformities.  Neck: supple, full ROM Lungs: CTAB, normal respiratory effort, no crackles, and no wheezes. Heart: RRR, no M/R/G.  Abdomen: Bowel sounds normal; abdomen soft and nontender. No masses, organomegaly or hernias noted. no guarding, no rebound tenderness. Vertical  scar appreciated below umbilicus.  Pulses: DP/PT are full and equal bilaterally.  Extremities: No cyanosis, clubbing, edema. Neurologic: No focal deficits, +5 strength globally, sensation grossly intact, A&Ox1.  Skin: Intact without suspicious lesions or rashes. Warm and dry. Genitalia: DRE normal; no blood. No prostate palpated. No signs of hemorrhoids or fissure. Psych: Mood and affect are normal; no evidence of anxiety or depression.  Labs and Imaging: Results for orders placed or performed during the hospital encounter of 06/23/15 (from the past 24 hour(s))  CBC     Status: Abnormal   Collection Time: 06/23/15  3:35 PM  Result Value Ref Range   WBC 13.2 (H) 4.0 - 10.5 K/uL   RBC 6.19 (H) 4.22 - 5.81 MIL/uL   Hemoglobin 15.1 13.0 - 17.0 g/dL   HCT 50.0 39.0 - 52.0 %    MCV 80.8 78.0 - 100.0 fL   MCH 24.4 (L) 26.0 - 34.0 pg   MCHC 30.2 30.0 - 36.0 g/dL   RDW 17.9 (H) 11.5 - 15.5 %   Platelets 546 (H) 150 - 400 K/uL  Type and screen [place]     Status: None   Collection Time: 06/23/15  3:46 PM  Result Value Ref Range   ABO/RH(D) A POS    Antibody Screen NEG    Sample Expiration 06/26/2015   ABO/Rh     Status: None   Collection Time: 06/23/15  3:46 PM  Result Value Ref Range   ABO/RH(D) A POS   Urinalysis, Routine w reflex microscopic (not at [facility])     Status: None   Collection Time: 06/23/15  3:55 PM  Result Value Ref Range   Color, Urine YELLOW YELLOW   APPearance CLEAR CLEAR   Specific Gravity, Urine 1.014 1.005 - 1.030   pH 5.0 5.0 - 8.0   Glucose, UA NEGATIVE NEGATIVE mg/dL   Hgb urine dipstick NEGATIVE NEGATIVE   Bilirubin Urine NEGATIVE NEGATIVE   Ketones, ur NEGATIVE NEGATIVE mg/dL   Protein, ur NEGATIVE NEGATIVE mg/dL   Nitrite NEGATIVE NEGATIVE   Leukocytes, UA NEGATIVE NEGATIVE  Comprehensive metabolic panel     Status: Abnormal   Collection Time: 06/23/15  8:25 PM  Result Value Ref Range   Sodium 140 135 - 145 mmol/L   Potassium 4.5 3.5 - 5.1 mmol/L   Chloride 108 101 - 111 mmol/L   CO2 23 22 - 32 mmol/L   Glucose, Bld 103 (H) 65 - 99 mg/dL   BUN 30 (H) 6 - 20 mg/dL   Creatinine, Ser 1.25 (H) 0.61 - 1.24 mg/dL   Calcium 8.6 (L) 8.9 - 10.3 mg/dL   Total Protein 6.1 (L) 6.5 - 8.1 g/dL   Albumin 3.3 (L) 3.5 - 5.0 g/dL   AST 24 15 - 41 U/L   ALT 23 17 - 63 U/L   Alkaline Phosphatase 66 38 - 126 U/L   Total Bilirubin 0.7 0.3 - 1.2 mg/dL   GFR calc non Af Amer 49 (L) >60 mL/min   GFR calc Af Amer 57 (L) >60 mL/min   Anion gap 9 5 - 15  POC occult blood, ED     Status: Abnormal   Collection Time: 06/23/15 10:03 PM  Result Value Ref Range   Fecal Occult Bld POSITIVE (A) NEGATIVE    Dg Chest 2 View  06/23/2015  CLINICAL DATA:  81 year old male with altered mental status x1 week. EXAM: CHEST  2 VIEW  COMPARISON:  None. FINDINGS: Two views of the chest  demonstrate emphysematous changes of the lungs. There is no focal consolidation, pleural effusion, or pneumothorax. A 5 mm nodular density in the right lower lung field on the frontal projection likely represents pulmonary nodule and less likely a vessel on end. Direct comparison with prior images if available recommended to evaluate for stability or interval change. Alternatively CT is recommended for further evaluation. Top-normal cardiac silhouette. The aorta is tortuous. The osseous structures are grossly unremarkable. IMPRESSION: No acute cardiopulmonary process. A 5 mm right lower lung field nodule. Electronically Signed   By: Anner Crete M.D.   On: 06/23/2015 22:08    Katheren Shams, DO 06/23/2015, 11:04 PM PGY-2, [place] [facility] pager: [phone], text pages welcome

## 2015-06-23 NOTE — ED Provider Notes (Signed)
Level V caveat dementia. Daughter reports the patient had bloody stools noticed earlier today and has had increased confusion over the past day.he's also complained of feeling dizzy  Orlie Dakin, MD 06/23/15 2308

## 2015-06-23 NOTE — ED Notes (Signed)
Pt here with c/o noticing bloody stools "I wiped myself and I noticed it, it was only a little spot." Pts daughter reports he has been more confused with an unsteady gait than usual but does have hx of Alzheimers.

## 2015-06-23 NOTE — ED Provider Notes (Signed)
CSN: [id number]     Arrival date & time 06/23/15  1506 History   First MD Initiated Contact with Patient 06/23/15 2009     Chief Complaint  Patient presents with  . Blood In Stools     (Consider location/radiation/quality/duration/timing/severity/associated sxs/prior Treatment) HPI Comments: 81 yo patient with a history of Alzheimer's Dementia, prostate CA s/p prostatectomy, HTN, hyperlipidemia presents with daughter who is care giver. He is here for evaluation of possible GI bleeding after seeing BRB on the toilet tissue earlier after passing a large, hard stool. He cannot deny or endorse melena. No abdominal pain, nausea, vomiting, recent illness or fever. No history of GI bleeding. Daughter states he is a bit more confused than his usual for his dementia and tires more easily this week. No falls or injury. Per daughter the patient had an episode of urinary incontinence today which is unusual.  The history is provided by the patient and a relative. No language interpreter was used.    Past Medical History  Diagnosis Date  . GERD (gastroesophageal reflux disease)   . Hyperlipidemia   . Alzheimer disease   . Macular degeneration   . Cancer [facility])     prostate  . Hypertension   . Thyroid disease    Past Surgical History  Procedure Laterality Date  . Prostatectomy     Family History  Problem Relation Age of Onset  .      Social History  Substance Use Topics  . Smoking status: Never Smoker   . Smokeless tobacco: None  . Alcohol Use: No    Review of Systems  Constitutional: Positive for activity change and fatigue. Negative for fever, chills and appetite change.  HENT: Negative for congestion and sore throat.   Respiratory: Negative for shortness of breath.   Cardiovascular: Negative for chest pain.  Gastrointestinal: Positive for constipation and blood in stool. Negative for nausea, vomiting, abdominal pain and diarrhea.  Genitourinary:       See HPI.  Musculoskeletal:  Negative for myalgias.  Skin: Negative for pallor.  Neurological: Positive for weakness. Negative for syncope and headaches.  Psychiatric/Behavioral: Positive for confusion.      Allergies  Review of patient's allergies indicates no known allergies.  Home Medications   Prior to Admission medications   Medication Sig Start Date End Date Taking? Authorizing Provider  amLODipine (NORVASC) 5 MG tablet Take 5 mg by mouth daily.    Historical Provider, MD  aspirin 81 MG tablet Take 81 mg by mouth daily.    Historical Provider, MD  Calcium Carb-Cholecalciferol (CALCIUM 600 + D PO) Take by mouth.    Historical Provider, MD  Cholecalciferol (D3 ADULT PO) Take by mouth.    Historical Provider, MD  galantamine (RAZADYNE) 12 MG tablet Take 1 tablet (12 mg total) by mouth 2 (two) times daily. 06/13/15   Pieter Partridge, DO  levothyroxine (SYNTHROID, LEVOTHROID) 88 MCG tablet Take 88 mcg by mouth daily before breakfast.    Historical Provider, MD  loratadine (CLARITIN) 10 MG tablet Take 10 mg by mouth at bedtime.    Historical Provider, MD  Multiple Vitamins-Minerals (PRESERVISION AREDS 2 PO) Take 1 tablet by mouth 2 (two) times daily.    Historical Provider, MD  naproxen (NAPROSYN) 500 MG tablet Take 500 mg by mouth 2 (two) times daily with a meal.    Historical Provider, MD  Omega-3 Fatty Acids (FISH OIL PO) Take 1,000 mg by mouth daily.    Historical Provider, MD  omeprazole (  PRILOSEC) 20 MG capsule Take 20 mg by mouth daily.    Historical Provider, MD  simvastatin (ZOCOR) 20 MG tablet Take 20 mg by mouth daily.    Historical Provider, MD   BP 153/73 mmHg  Pulse 67  Temp(Src) 98.1 F (36.7 C) (Oral)  Resp 17  SpO2 93% Physical Exam  Constitutional: He is oriented to person, place, and time. He appears well-developed and well-nourished.  HENT:  Head: Normocephalic.  Eyes: Conjunctivae are normal.  Neck: Normal range of motion. Neck supple.  Cardiovascular: Normal rate and regular rhythm.    No murmur heard. Pulmonary/Chest: Effort normal and breath sounds normal. He has no wheezes. He has no rales. He exhibits no tenderness.  Abdominal: Soft. Bowel sounds are normal. He exhibits no distension. There is no tenderness. There is no rebound and no guarding.  Genitourinary:  Red blood on digital rectal exam. No stool in rectal vault. No hemorrhoids visualized. No fissures. Rectum non-tender.  Musculoskeletal: Normal range of motion.  Neurological: He is alert and oriented to person, place, and time. Coordination normal.  Skin: Skin is warm and dry. No rash noted.  Psychiatric: He has a normal mood and affect.    ED Course  Procedures (including critical care time) Labs Review Labs Reviewed  URINALYSIS, ROUTINE W REFLEX MICROSCOPIC (NOT AT [facility])  CBC  COMPREHENSIVE METABOLIC PANEL  POC OCCULT BLOOD, ED  TYPE AND SCREEN  ABO/RH   Results for orders placed or performed during the hospital encounter of 06/23/15  CBC  Result Value Ref Range   WBC 13.2 (H) 4.0 - 10.5 K/uL   RBC 6.19 (H) 4.22 - 5.81 MIL/uL   Hemoglobin 15.1 13.0 - 17.0 g/dL   HCT 50.0 39.0 - 52.0 %   MCV 80.8 78.0 - 100.0 fL   MCH 24.4 (L) 26.0 - 34.0 pg   MCHC 30.2 30.0 - 36.0 g/dL   RDW 17.9 (H) 11.5 - 15.5 %   Platelets 546 (H) 150 - 400 K/uL  Urinalysis, Routine w reflex microscopic (not at [facility])  Result Value Ref Range   Color, Urine YELLOW YELLOW   APPearance CLEAR CLEAR   Specific Gravity, Urine 1.014 1.005 - 1.030   pH 5.0 5.0 - 8.0   Glucose, UA NEGATIVE NEGATIVE mg/dL   Hgb urine dipstick NEGATIVE NEGATIVE   Bilirubin Urine NEGATIVE NEGATIVE   Ketones, ur NEGATIVE NEGATIVE mg/dL   Protein, ur NEGATIVE NEGATIVE mg/dL   Nitrite NEGATIVE NEGATIVE   Leukocytes, UA NEGATIVE NEGATIVE  Comprehensive metabolic panel  Result Value Ref Range   Sodium 140 135 - 145 mmol/L   Potassium 4.5 3.5 - 5.1 mmol/L   Chloride 108 101 - 111 mmol/L   CO2 23 22 - 32 mmol/L   Glucose, Bld 103 (H) 65 - 99  mg/dL   BUN 30 (H) 6 - 20 mg/dL   Creatinine, Ser 1.25 (H) 0.61 - 1.24 mg/dL   Calcium 8.6 (L) 8.9 - 10.3 mg/dL   Total Protein 6.1 (L) 6.5 - 8.1 g/dL   Albumin 3.3 (L) 3.5 - 5.0 g/dL   AST 24 15 - 41 U/L   ALT 23 17 - 63 U/L   Alkaline Phosphatase 66 38 - 126 U/L   Total Bilirubin 0.7 0.3 - 1.2 mg/dL   GFR calc non Af Amer 49 (L) >60 mL/min   GFR calc Af Amer 57 (L) >60 mL/min   Anion gap 9 5 - 15  POC occult blood, ED  Result Value Ref  Range   Fecal Occult Bld POSITIVE (A) NEGATIVE  Type and screen [facility]  Result Value Ref Range   ABO/RH(D) A POS    Antibody Screen NEG    Sample Expiration 06/26/2015   ABO/Rh  Result Value Ref Range   ABO/RH(D) A POS    Dg Chest 2 View  06/23/2015  CLINICAL DATA:  81 year old male with altered mental status x1 week. EXAM: CHEST  2 VIEW COMPARISON:  None. FINDINGS: Two views of the chest demonstrate emphysematous changes of the lungs. There is no focal consolidation, pleural effusion, or pneumothorax. A 5 mm nodular density in the right lower lung field on the frontal projection likely represents pulmonary nodule and less likely a vessel on end. Direct comparison with prior images if available recommended to evaluate for stability or interval change. Alternatively CT is recommended for further evaluation. Top-normal cardiac silhouette. The aorta is tortuous. The osseous structures are grossly unremarkable. IMPRESSION: No acute cardiopulmonary process. A 5 mm right lower lung field nodule. Electronically Signed   By: Anner Crete M.D.   On: 06/23/2015 22:08    Imaging Review No results found. I have personally reviewed and evaluated these images and lab results as part of my medical decision-making.   EKG Interpretation None      MDM   Final diagnoses:  None    1. Rectal bleeding 2. Weakness  The patient is well appearing, non-toxic, awake, alert. He is found to have grossly bloody digital rectal exam, mild  leukocytosis. He is a frail 89-yo patient that would benefit from hospitalization for further evaluation of rectal bleeding and weakness. Discussed with [facility] who accepts for admission.    Charlann Lange, PA-C 06/23/15 [id number]  Orlie Dakin, MD 06/24/15 [phone]

## 2015-06-23 NOTE — ED Notes (Signed)
Pt has c/o bright red blood on toilet paper when he had a BM today. Daughter is at bedside and states he has been increasingly confused this week. Pt has a hx of dementia.

## 2015-06-24 ENCOUNTER — Observation Stay (HOSPITAL_COMMUNITY): Payer: Medicare Other

## 2015-06-24 ENCOUNTER — Encounter (HOSPITAL_COMMUNITY): Payer: Self-pay | Admitting: Family Medicine

## 2015-06-24 DIAGNOSIS — Z8546 Personal history of malignant neoplasm of prostate: Secondary | ICD-10-CM | POA: Insufficient documentation

## 2015-06-24 DIAGNOSIS — M810 Age-related osteoporosis without current pathological fracture: Secondary | ICD-10-CM

## 2015-06-24 DIAGNOSIS — K625 Hemorrhage of anus and rectum: Secondary | ICD-10-CM

## 2015-06-24 DIAGNOSIS — R195 Other fecal abnormalities: Secondary | ICD-10-CM | POA: Diagnosis not present

## 2015-06-24 DIAGNOSIS — M48061 Spinal stenosis, lumbar region without neurogenic claudication: Secondary | ICD-10-CM | POA: Diagnosis present

## 2015-06-24 DIAGNOSIS — E785 Hyperlipidemia, unspecified: Secondary | ICD-10-CM

## 2015-06-24 DIAGNOSIS — E039 Hypothyroidism, unspecified: Secondary | ICD-10-CM | POA: Insufficient documentation

## 2015-06-24 DIAGNOSIS — I444 Left anterior fascicular block: Secondary | ICD-10-CM

## 2015-06-24 DIAGNOSIS — K5900 Constipation, unspecified: Secondary | ICD-10-CM | POA: Diagnosis not present

## 2015-06-24 DIAGNOSIS — R531 Weakness: Secondary | ICD-10-CM

## 2015-06-24 HISTORY — DX: Age-related osteoporosis without current pathological fracture: M81.0

## 2015-06-24 HISTORY — DX: Hyperlipidemia, unspecified: E78.5

## 2015-06-24 HISTORY — DX: Left anterior fascicular block: I44.4

## 2015-06-24 HISTORY — DX: Hypothyroidism, unspecified: E03.9

## 2015-06-24 HISTORY — DX: Hemorrhage of anus and rectum: K62.5

## 2015-06-24 HISTORY — DX: Personal history of malignant neoplasm of prostate: Z85.46

## 2015-06-24 LAB — LIPID PANEL
Cholesterol: 125 mg/dL (ref 0–200)
HDL: 42 mg/dL (ref >40)
LDL CALC: 65 mg/dL (ref 0–99)
TRIGLYCERIDES: 91 mg/dL (ref <150)
Total CHOL/HDL Ratio: 3 RATIO
VLDL: 18 mg/dL (ref 0–40)

## 2015-06-24 LAB — COMPREHENSIVE METABOLIC PANEL
ALBUMIN: 2.8 g/dL — AB (ref 3.5–5.0)
ALK PHOS: 61 U/L (ref 38–126)
ALT: 22 U/L (ref 17–63)
AST: 23 U/L (ref 15–41)
Anion gap: 10 (ref 5–15)
BILIRUBIN TOTAL: 0.7 mg/dL (ref 0.3–1.2)
BUN: 23 mg/dL — AB (ref 6–20)
CALCIUM: 8.2 mg/dL — AB (ref 8.9–10.3)
CO2: 26 mmol/L (ref 22–32)
CREATININE: 1.32 mg/dL — AB (ref 0.61–1.24)
Chloride: 105 mmol/L (ref 101–111)
GFR calc Af Amer: 53 mL/min — ABNORMAL LOW (ref >60)
GFR calc non Af Amer: 46 mL/min — ABNORMAL LOW (ref >60)
GLUCOSE: 77 mg/dL (ref 65–99)
Potassium: 4.5 mmol/L (ref 3.5–5.1)
SODIUM: 141 mmol/L (ref 135–145)
Total Protein: 5.3 g/dL — ABNORMAL LOW (ref 6.5–8.1)

## 2015-06-24 LAB — CBC
HCT: 47 % (ref 39.0–52.0)
HEMOGLOBIN: 14.1 g/dL (ref 13.0–17.0)
MCH: 24 pg — AB (ref 26.0–34.0)
MCHC: 30 g/dL (ref 30.0–36.0)
MCV: 79.9 fL (ref 78.0–100.0)
Platelets: 512 10*3/uL — ABNORMAL HIGH (ref 150–400)
RBC: 5.88 MIL/uL — ABNORMAL HIGH (ref 4.22–5.81)
RDW: 17.8 % — AB (ref 11.5–15.5)
WBC: 11 10*3/uL — ABNORMAL HIGH (ref 4.0–10.5)

## 2015-06-24 LAB — PROTIME-INR
INR: 1.23 (ref 0.00–1.49)
Prothrombin Time: 15.7 seconds — ABNORMAL HIGH (ref 11.6–15.2)

## 2015-06-24 LAB — TSH: TSH: 4.636 u[IU]/mL — AB (ref 0.350–4.500)

## 2015-06-24 MED ORDER — SODIUM CHLORIDE 0.9 % IJ SOLN
3.0000 mL | Freq: Two times a day (BID) | INTRAMUSCULAR | Status: DC
  Administered 2015-06-24 (×2): 3 mL via INTRAVENOUS

## 2015-06-24 MED ORDER — ASPIRIN 81 MG PO CHEW
81.0000 mg | CHEWABLE_TABLET | Freq: Every day | ORAL | Status: DC
  Administered 2015-06-24: 81 mg via ORAL
  Filled 2015-06-24: qty 1

## 2015-06-24 MED ORDER — SODIUM CHLORIDE 0.9 % IJ SOLN: 3.0000 mL | INTRAMUSCULAR | Status: DC | PRN

## 2015-06-24 MED ORDER — POLYETHYLENE GLYCOL 3350 17 G PO PACK
17.0000 g | PACK | Freq: Every day | ORAL | Status: DC
  Administered 2015-06-24: 17 g via ORAL
  Filled 2015-06-24: qty 1

## 2015-06-24 MED ORDER — DOCUSATE SODIUM 100 MG PO CAPS
100.0000 mg | ORAL_CAPSULE | Freq: Two times a day (BID) | ORAL | Status: DC
  Administered 2015-06-24 (×2): 100 mg via ORAL
  Filled 2015-06-24 (×2): qty 1

## 2015-06-24 MED ORDER — ENOXAPARIN SODIUM 40 MG/0.4ML ~~LOC~~ SOLN
40.0000 mg | Freq: Every day | SUBCUTANEOUS | Status: DC
  Filled 2015-06-24: qty 0.4

## 2015-06-24 MED ORDER — SODIUM CHLORIDE 0.9 % IV BOLUS (SEPSIS)
500.0000 mL | Freq: Once | INTRAVENOUS | Status: AC
  Administered 2015-06-24: 500 mL via INTRAVENOUS

## 2015-06-24 MED ORDER — ACETAMINOPHEN 325 MG PO TABS: 650.0000 mg | ORAL_TABLET | Freq: Four times a day (QID) | ORAL | Status: DC | PRN

## 2015-06-24 MED ORDER — SODIUM CHLORIDE 0.9 % IV SOLN: 250.0000 mL | INTRAVENOUS | Status: DC | PRN

## 2015-06-24 MED ORDER — GALANTAMINE HYDROBROMIDE 4 MG PO TABS
12.0000 mg | ORAL_TABLET | Freq: Two times a day (BID) | ORAL | Status: DC
  Administered 2015-06-24 (×2): 12 mg via ORAL
  Filled 2015-06-24 (×3): qty 3

## 2015-06-24 MED ORDER — AMLODIPINE BESYLATE 5 MG PO TABS
5.0000 mg | ORAL_TABLET | Freq: Every day | ORAL | Status: DC
  Administered 2015-06-24: 5 mg via ORAL
  Filled 2015-06-24: qty 1

## 2015-06-24 MED ORDER — PANTOPRAZOLE SODIUM 40 MG PO TBEC
40.0000 mg | DELAYED_RELEASE_TABLET | Freq: Every day | ORAL | Status: DC
  Administered 2015-06-24: 40 mg via ORAL
  Filled 2015-06-24: qty 1

## 2015-06-24 MED ORDER — ACETAMINOPHEN 650 MG RE SUPP: 650.0000 mg | Freq: Four times a day (QID) | RECTAL | Status: DC | PRN

## 2015-06-24 MED ORDER — SIMVASTATIN 20 MG PO TABS: 20.0000 mg | ORAL_TABLET | Freq: Every day | ORAL | Status: DC

## 2015-06-24 MED ORDER — LEVOTHYROXINE SODIUM 88 MCG PO TABS
88.0000 ug | ORAL_TABLET | Freq: Every day | ORAL | Status: DC
  Administered 2015-06-24: 88 ug via ORAL
  Filled 2015-06-24: qty 1

## 2015-06-24 NOTE — Evaluation (Signed)
Occupational Therapy Evaluation Patient Details Name: Billy Banks MRN: [id number] DOB: 11-Oct-1922 Today's Date: 06/24/2015    History of Present Illness 81 yo admitted with workup for possible GIB and one episode of incontinence bladder. PMH: alheimers dementia, prostate CA s/p prostatectomy, HTN, hyperlipidemia, thyroid disease,    Clinical Impression   PT admitted with weakness with workup for possible GIB. Pt currently with functional limitiations due to the deficits listed below (see OT problem list). PTA living at home with family using 4ww and going to adult day center 4 days per week. NO family present during evaluation.  Pt will benefit from skilled OT to increase their independence and safety with adls and balance to allow discharge HHOT due to reports of falls and fatigued quickly. PT could benefit from continued therapy to incr activity tolerance. Pt incr fall risk due to weakness.      Follow Up Recommendations  Home health OT;Supervision - Intermittent    Equipment Recommendations  None recommended by OT    Recommendations for Other Services       Precautions / Restrictions Precautions Precautions: Fall      Mobility Bed Mobility Overal bed mobility: Needs Assistance Bed Mobility: Supine to Sit;Sit to Supine     Supine to sit: Modified independent (Device/Increase time) Sit to supine: Min assist   General bed mobility comments: required (A) to place BIL LE back into the bed   Transfers Overall transfer level: Needs assistance   Transfers: Sit to/from Stand Sit to Stand: Supervision         General transfer comment: use of bil UE to push into static stnading    Balance                                            ADL Overall ADL's : At baseline                                       General ADL Comments: pt fatigues quickly after ambulation from bed to bathroom, peri care standing for bowel movement ( no  bleeding noted) and static standing at sink for hand hygiene/ oral care. Pt reports "my legs are shaky I have to sit now" Pt unable to continue and return to supine. Pt reports only having 4 hours of sleep due to course of arrival from home until now.      Vision     Perception     Praxis      Pertinent Vitals/Pain Pain Assessment: No/denies pain     Hand Dominance Right   Extremity/Trunk Assessment Upper Extremity Assessment Upper Extremity Assessment: Overall WFL for tasks assessed   Lower Extremity Assessment Lower Extremity Assessment: Defer to PT evaluation   Cervical / Trunk Assessment Cervical / Trunk Assessment: Kyphotic   Communication Communication Communication: No difficulties   Cognition Arousal/Alertness: Awake/alert Behavior During Therapy: WFL for tasks assessed/performed Overall Cognitive Status: History of cognitive impairments - at baseline                     General Comments       Exercises       Shoulder Instructions      Home Living Family/patient expects to be discharged to:: Private residence Living Arrangements: Children;Other relatives Available Help  at Discharge: Family Type of Home: House Home Access: [place]: One level     Bathroom Shower/Tub: Other (comment) (sponge bath and bath at senior center [employer] and friday)   Bathroom Toilet: Handicapped height     Home Equipment: Wheelchair - [employer] - 4 wheels   Additional Comments: pt reports going to senior center x4 days per week. states he lives with youngest daughter that has very small children ( under age of 82 yo)       Prior Functioning/Environment Level of Independence: Independent with assistive device(s)        Comments: reports using w/c to go to doctor appointments    OT Diagnosis: Generalized weakness   OT Problem List: Decreased strength;Decreased activity tolerance   OT Treatment/Interventions:      OT Goals(Current  goals can be found in the care plan section) Acute Rehab OT Goals Patient Stated Goal: to go back to senior center "they are good to me there" they call me "speedy"  OT Frequency:     Barriers to D/C:            Co-evaluation              End of Session Equipment Utilized During Treatment: Rolling walker Nurse Communication: Mobility status;Precautions  Activity Tolerance: Patient limited by fatigue Patient left: in bed;with call bell/phone within reach;with bed alarm set   Time: 0912-0936 OT Time Calculation (min): 24 min Charges:  OT General Charges $OT Visit: 1 Procedure OT Evaluation $Initial OT Evaluation Tier I: 1 Procedure G-Codes: OT G-codes **NOT FOR INPATIENT CLASS** Functional Assessment Tool Used: clinical judgement Functional Limitation: Self care Self Care Current Status [id number]): At least 1 percent but less than 20 percent impaired, limited or restricted Self Care Goal Status [id number]): At least 1 percent but less than 20 percent impaired, limited or restricted Self Care Discharge Status [phone]): At least 1 percent but less than 20 percent impaired, limited or restricted  Parke Poisson B 06/24/2015, 9:50 AM   Jeri Modena   OTR/L Pager: [phone] Office: [phone] .

## 2015-06-24 NOTE — Progress Notes (Signed)
New Admission Note:  Arrival Method: via stretcher with nurse Tech Mental Orientation: Alert&oriented x4 Telemetry: N/A Assessment: Completed Skin: ecchymosis on arms  [id number] wrist, saline locked Pain: denies any pain Tubes: n/a Safety Measures: Safety Fall Prevention Plan was given, discussed and signed. Admission: initiated [facility] Orientation: Patient has been orientated to the room, unit and the staff. Family: Daughter with patient on arrival  Orders have been reviewed and implemented. Will continue to monitor the patient. Call light has been placed within reach and bed alarm has been activated.   Leandro Reasoner BSN, RN  Phone Number: [phone] [facility]

## 2015-06-24 NOTE — Care Management Obs Status (Signed)
[facility] NOTIFICATION   Patient Details  Name: Itsuo Pacifico MRN: [id number] Date of Birth: Sep 08, 1922   Medicare Observation Status Notification Given:  Yes    CrutchfieldAntony Haste, RN 06/24/2015, 11:32 AM

## 2015-06-24 NOTE — Evaluation (Signed)
Physical Therapy Evaluation and Discharge Patient Details Name: Billy Banks MRN: [id number] DOB: 28-Oct-1922 Today's Date: 06/24/2015   History of Present Illness  81 yo admitted with workup for possible GIB and one episode of incontinence bladder. PMH: alheimers dementia, prostate CA s/p prostatectomy, HTN, hyperlipidemia, thyroid disease,   Clinical Impression  Patient evaluated by Physical Therapy with no further acute PT needs identified. All education has been completed and the patient has no further questions. Apparently mobilizing near baseline ability. Ambulatory up to 75 feet today with a rolling walker. Attends exercise classes at senior center. No falls since moving in daughter. Good family support at home. See below for any follow-up Physical Therapy or equipment needs. PT is signing off. Thank you for this referral.     Follow Up Recommendations No PT follow up    Equipment Recommendations  None recommended by PT    Recommendations for Other Services       Precautions / Restrictions Precautions Precautions: Fall Restrictions Weight Bearing Restrictions: No      Mobility  Bed Mobility Overal bed mobility: Needs Assistance Bed Mobility: Supine to Sit     Supine to sit: Supervision Sit to supine: Min assist   General bed mobility comments: Supevision for safety. Required extra time. No assist needed  Transfers Overall transfer level: Needs assistance Equipment used: Rolling walker (2 wheeled) Transfers: Sit to/from Stand Sit to Stand: Supervision         General transfer comment: Supervision for safety. Minimal sway . VC for hand placement. Good stability once upright.  Ambulation/Gait Ambulation/Gait assistance: Supervision Ambulation Distance (Feet): 75 Feet Assistive device: Rolling walker (2 wheeled) Gait Pattern/deviations: Step-through pattern;Decreased stride length;Trunk flexed   Gait velocity interpretation: at or above normal speed for  age/gender General Gait Details: VC for walker placement for proximity. No overt loss of balance noted, no buckling. Intermittent cues for upright posture.  Stairs            Wheelchair Mobility    Modified Rankin (Stroke Patients Only)       Balance Overall balance assessment: Needs assistance Sitting-balance support: No upper extremity supported;Feet supported Sitting balance-Leahy Scale: Good     Standing balance support: No upper extremity supported Standing balance-Leahy Scale: Fair                               Pertinent Vitals/Pain Pain Assessment: No/denies pain    Home Living Family/patient expects to be discharged to:: Private residence Living Arrangements: Children;Other relatives Available Help at Discharge: Family Type of Home: House Home Access: Ramped entrance     Home Layout: One level Home Equipment: Wheelchair - [employer] - 4 wheels;Walker - 2 wheels Additional Comments: pt reports going to senior center x4 days per week. states he lives with youngest daughter that has very small children ( under age of 47 yo)  Exercises 2x/day while at senior center    Prior Function Level of Independence: Independent with assistive device(s)         Comments: reports using w/c to go to doctor appointments. Uses wheelchair while at senior center. They call him speedy.     Hand Dominance   Dominant Hand: Right    Extremity/Trunk Assessment   Upper Extremity Assessment: Defer to OT evaluation           Lower Extremity Assessment: Generalized weakness      Cervical / Trunk Assessment: Kyphotic  Communication  Communication: No difficulties  Cognition Arousal/Alertness: Awake/alert Behavior During Therapy: WFL for tasks assessed/performed Overall Cognitive Status: History of cognitive impairments - at baseline                      General Comments General comments (skin integrity, edema, etc.): Daughter present.  States neurologist advised against continuing PT at home. Encouraged daughter to ambulate frequently with pt at home using rollator to sit as needed. Continue with senior center exercise program for strength, and conditioning.    Exercises        Assessment/Plan    PT Assessment Patent does not need any further PT services  PT Diagnosis Abnormality of gait;Generalized weakness   PT Problem List    PT Treatment Interventions     PT Goals (Current goals can be found in the Care Plan section) Acute Rehab PT Goals Patient Stated Goal: to go back to senior center "they are good to me there" they call me "speedy" PT Goal Formulation: All assessment and education complete, DC therapy    Frequency     Barriers to discharge        Co-evaluation               End of Session   Activity Tolerance: Patient tolerated treatment well Patient left: in chair;with call bell/phone within reach;with chair alarm set;with family/visitor present Nurse Communication: Mobility status    Functional Assessment Tool Used: Clinical observation Functional Limitation: Mobility: Walking and moving around Mobility: Walking and Moving Around Current Status [id number]): At least 1 percent but less than 20 percent impaired, limited or restricted Mobility: Walking and Moving Around Goal Status [phone]): At least 1 percent but less than 20 percent impaired, limited or restricted Mobility: Walking and Moving Around Discharge Status [phone]): At least 1 percent but less than 20 percent impaired, limited or restricted    Time: 1149-1205 PT Time Calculation (min) (ACUTE ONLY): 16 min   Charges:   PT Evaluation $Initial PT Evaluation Tier I: 1 Procedure     PT G Codes:   PT G-Codes **NOT FOR INPATIENT CLASS** Functional Assessment Tool Used: Clinical observation Functional Limitation: Mobility: Walking and moving around Mobility: Walking and Moving Around Current Status [id number]): At least 1 percent but less  than 20 percent impaired, limited or restricted Mobility: Walking and Moving Around Goal Status [phone]): At least 1 percent but less than 20 percent impaired, limited or restricted Mobility: Walking and Moving Around Discharge Status [phone]): At least 1 percent but less than 20 percent impaired, limited or restricted    Ellouise Newer 06/24/2015, 12:59 PM Camille Bal [place], [place]

## 2015-06-24 NOTE — Progress Notes (Signed)
Family Medicine Teaching Service Daily Progress Note Intern Pager: [phone]  Patient name: Billy Banks Medical record number: [id number] Date of birth: [birth year] Age: 81 y.o. Gender: male  Primary Care Provider: Dwaine Deter, MD Consultants: None Code Status: FULL   Assessment and Plan: Billy Banks is a 81 y.o. male presenting with blood in stools and weakness. PMH is significant for history of Alzheimer's Dementia, prostate CA s/p prostatectomy, HTN, hyperlipidemia, and thyroid disease.  #Rectal Bleeding: Unwitnessed episode of BRBPR x1. Happened once before when had bowel blockage per daughter last year. No further episodes since day of admission. Hemodynamically stable. Hemoglobin 15.1 on admission. On DRE no blood on glove on arrival to floor; in ED found to have grossly bloody digital rectal exam. No visible hemorrhoids or fissure; however patient was straining with episode and large stool. Most likely cause of bleed is due to constipation causing bleeding. Not on blood thinners. PT slightly elevated at 15.7, INR WNL at 1.23.  FOBT positive. Repeat Hgb still stable at 14.1 but slightly decreased from 15.1 yesterday. Abdominal XR obtained this AM showed moderate stool burden with no bowel distention.  -stool softener started -no need for GI consult at this time; consider if bleeding worsens  #AKI: Unsure of baseline renal function. No known CKD. BUN/Cr ratio >20 indicating a prerenal source. Daughter does state he has not been hydrating well. Cr slightly increased today to 1.32 (from 1.25 yesterday).  -repeat Cr in AM -monitor I/O  #Urinary Incontinence: One episode on day of admission. Has never been an issue before. UA was normal. Patient s/p prostatectomy. Patient has had complete control of bladder and bowels since arrival to floor from ED.  -continue to monitor -monitor I/O  #Weakness: No weakness appreciated on repeat exam. Does receive intense physical therapy  twice daily at day center.  -PT/OT consult  #HTN: BP at goal for age; <150/90.  -continue home amlodipine 5mg   #Alzheimers dementia: Recently diagnosed about 8 months ago. Started taking galantamine without signs of rapid decline of dementia. Patient A&Ox3 on exam. No signs of delirium. -continue galantimine -consider MMSE for baseline cognition  -monitor for signs of delirium -orient daily  #HLD: Unsure of baseline. Lipid panel is entirely WNL, suggesting patient is well-controlled on current statin regimen. -Continue statin  #Thyroid disease: No TSH levels in chart.  -TSH pending -continue current dose of levothyroxine  FEN/GI: heart healthy diet, continue home PPI, Saline lock IV Prophylaxis: Due to bleed hold off on pharmocological VTE prophylaxis; SCDs ordered  Disposition: home pending medical improvement   Subjective:  Billy Banks reports that he is doing very well this morning. He is oriented to person, place, and situation. He denies any episodes of urinary incontinence or any more blood on toilet paper or in toilet. He did report some nausea overnight, which prompted abdominal imaging. He denies nausea or vomiting this AM.   Objective: Temp:  [97.8 F (36.6 C)-98.5 F (36.9 C)] 97.8 F (36.6 C) (12/02 0442) Pulse Rate:  [64-83] 73 (12/02 0442) Resp:  [13-23] 19 (12/02 0442) BP: (125-168)/(58-106) 155/58 mmHg (12/02 0442) SpO2:  [91 %-96 %] 95 % (12/02 0442) Weight:  [177 lb 14.4 oz (80.695 kg)] 177 lb 14.4 oz (80.695 kg) (12/02 0019) Physical Exam: General: very pleasant well-appearing, well-nourished male lying in bed Cardiovascular: RRR, no murmurs appreciated Respiratory: CTAB, no wheezes, no increased WOB Abdomen: soft, non-tender, non-distended, +BS Extremities: SCDs on, warm and well-perfused  Laboratory:  Recent Labs Lab 06/23/15 1535  WBC 13.2*  HGB 15.1  HCT 50.0  PLT 546*    Recent Labs Lab 06/23/15 2025  NA 140  K 4.5  CL 108  CO2  23  BUN 30*  CREATININE 1.25*  CALCIUM 8.6*  PROT 6.1*  BILITOT 0.7  ALKPHOS 66  ALT 23  AST 24  GLUCOSE 103*    Imaging/Diagnostic Tests: Dg Chest 2 View  06/23/2015  CLINICAL DATA:  81 year old male with altered mental status x1 week. EXAM: CHEST  2 VIEW COMPARISON:  None. FINDINGS: Two views of the chest demonstrate emphysematous changes of the lungs. There is no focal consolidation, pleural effusion, or pneumothorax. A 5 mm nodular density in the right lower lung field on the frontal projection likely represents pulmonary nodule and less likely a vessel on end. Direct comparison with prior images if available recommended to evaluate for stability or interval change. Alternatively CT is recommended for further evaluation. Top-normal cardiac silhouette. The aorta is tortuous. The osseous structures are grossly unremarkable. IMPRESSION: No acute cardiopulmonary process. A 5 mm right lower lung field nodule. Electronically Signed   By: Anner Crete M.D.   On: 06/23/2015 22:08    Verner Mould, MD 06/24/2015, 7:03 AM PGY-1, [place] Intern pager: [phone], text pages welcome

## 2015-06-24 NOTE — Discharge Instructions (Signed)
Please continue to take your medications as you were before your hospitalization.   Also, please make sure to schedule an appointment with a primary doctor or the [facility] within one week for a hospital follow-up.  If you feel dizzy or very weak, or if you notice more blood with bowel movements or blood in your stool, please return to the emergency room.

## 2015-06-24 NOTE — Discharge Summary (Signed)
[facility] Discharge Summary  Patient name: Billy Banks Medical record number: [id number] Date of birth: 12/12/1922 Age: 81 y.o. Gender: male Date of Admission: 06/23/2015  Date of Discharge: 06/24/2015 Admitting Physician: Blane Ohara McDiarmid, MD  Primary Care Provider: Dwaine Deter, MD Consultants: none  Indication for Hospitalization: BRBPR, weakness  Discharge Diagnoses/Problem List:  Patient Active Problem List   Diagnosis Date Noted  . Bright red blood per rectum 06/24/2015  . Osteoporosis 06/24/2015  . Left anterior fascicular block 06/24/2015  . H/O malignant neoplasm of prostate 06/24/2015  . Adult hypothyroidism 06/24/2015  . HLD (hyperlipidemia) 06/24/2015  . Spinal stenosis of lumbar region   . Weakness 06/23/2015  . Late onset Alzheimer's disease without behavioral disturbance 05/02/2015  . Essential hypertension 05/02/2015  . Excessive falling 08/08/2014  . History of small bowel obstruction 11/17/2013    Disposition: home  Discharge Condition: stable  Discharge Exam:  General: very pleasant well-appearing, well-nourished male lying in bed Cardiovascular: RRR, no murmurs appreciated Respiratory: CTAB, no wheezes, no increased WOB Abdomen: soft, non-tender, non-distended, +BS Extremities: SCDs on, warm and well-perfused  Brief Hospital Course:  Mr. Cima presented after noticing two spots of bright red blood after a bowel movement. He reported that he had been constipated and was straining during this bowel movement, but became concerned when he saw blood on the toilet paper. He was subsequently brought to the ED by his daughter for further evaluation. His daughter also reported that he has been weak recently.   Rectal bleeding DRE in the ED showed gross blood on the examiner's glove. No visible hemorrhoids or fissures were noted. Hgb at that time was stable at 15.1.  Upon arrival to the floor, repeat DRE showed no gross  blood. Patient did well overnight, with no additional episodes of bleeding. Patient did have another bowel movement during admission with no blood present in the stool or on toilet paper. Repeat Hgb was 14.1. Patient had one episode of nausea and vomiting, so abdominal XR was obtained, which showed moderate stool burden with no bowel distention.  As patient's labs and imaging showed no abnormalities, patient's symptoms were improved, and he had remained hemodynamically stable throughout admission, he was determined stable for discharge home.   AKI Patient's Cr was 1.25 on admission, and 1.32 when repeated on second day of admission. The patient's daughter reported that he had decreased PO intake before admission, so this was most likely due to dehydration. He had good PO intake throughout hospitalization and received IV fluids as well.   Urinary Incontinence Patient had one episode of urinary incontinence while in the ED, which his daughter reported had never happened before. He was completely continent of both bladder and bowel during hospitalization, so follow-up or consultation of urology was not deemed necessary.   Weakness  Patient's daughter reported that he had been weaker than usual recently, and specifically had not been able to walk as far as he used to without having to take a break and sit down. Patient was not found to have weakness during any physical exam. Physical therapy did not find patient to have weakness either, and did not recommend further follow-up, other than continuing his regular daily physical therapy at the adult day care which he attends.    Issues for Follow Up:  1. TSH slightly increased during admission (4.636). Consider rechecking and/or adjusting Synthroid dose accordingly.  2. Patient found to have AKI (Cr 1.32). Patient's baseline Cr is unknown, so unsure  of severity. Would recommend repeat CMP for monitoring.   Significant Procedures: none  Significant Labs  and Imaging:   Recent Labs Lab 06/23/15 1535 06/24/15 0618  WBC 13.2* 11.0*  HGB 15.1 14.1  HCT 50.0 47.0  PLT 546* 512*    Recent Labs Lab 06/23/15 2025 06/24/15 0618  NA 140 141  K 4.5 4.5  CL 108 105  CO2 23 26  GLUCOSE 103* 77  BUN 30* 23*  CREATININE 1.25* 1.32*  CALCIUM 8.6* 8.2*  ALKPHOS 66 61  AST 24 23  ALT 23 22  ALBUMIN 3.3* 2.8*    Dg Chest 2 View  06/23/2015  CLINICAL DATA:  81 year old male with altered mental status x1 week. EXAM: CHEST  2 VIEW COMPARISON:  None. FINDINGS: Two views of the chest demonstrate emphysematous changes of the lungs. There is no focal consolidation, pleural effusion, or pneumothorax. A 5 mm nodular density in the right lower lung field on the frontal projection likely represents pulmonary nodule and less likely a vessel on end. Direct comparison with prior images if available recommended to evaluate for stability or interval change. Alternatively CT is recommended for further evaluation. Top-normal cardiac silhouette. The aorta is tortuous. The osseous structures are grossly unremarkable. IMPRESSION: No acute cardiopulmonary process. A 5 mm right lower lung field nodule. Electronically Signed   By: Anner Crete M.D.   On: 06/23/2015 22:08   Dg Abd Portable 1v  06/24/2015  CLINICAL DATA:  Constipation. EXAM: PORTABLE ABDOMEN - 1 VIEW COMPARISON:  No prior . FINDINGS: Soft tissue structures are unremarkable. No bowel distention. Moderate stool burden. No free air. Pelvic phleboliths and atherosclerotic vascular calcifications noted. Surgical clips in the pelvis. Degenerative changes lumbar spine. IMPRESSION: 1. Moderate stool burden.  No bowel distention. 2. Atherosclerotic vascular disease. Electronically Signed   By: Marcello Moores  Register   On: 06/24/2015 07:46   Results/Tests Pending at Time of Discharge: none  Discharge Medications:    Medication List    TAKE these medications        amLODipine 5 MG tablet  Commonly known as:   NORVASC  Take 5 mg by mouth daily.     aspirin 81 MG tablet  Take 81 mg by mouth daily.     CALCIUM 600 + D PO  Take 1 tablet by mouth 2 (two) times daily.     D3 ADULT PO  Take 1 tablet by mouth daily.     FISH OIL PO  Take 1,000 mg by mouth 2 (two) times daily.     galantamine 12 MG tablet  Commonly known as:  RAZADYNE  Take 1 tablet (12 mg total) by mouth 2 (two) times daily.     levothyroxine 88 MCG tablet  Commonly known as:  SYNTHROID, LEVOTHROID  Take 88 mcg by mouth daily before breakfast.     loratadine 10 MG tablet  Commonly known as:  CLARITIN  Take 10 mg by mouth daily.     naproxen 500 MG tablet  Commonly known as:  NAPROSYN  Take 500 mg by mouth 2 (two) times daily with a meal.     omeprazole 20 MG capsule  Commonly known as:  PRILOSEC  Take 20 mg by mouth daily.     PRESERVISION AREDS 2 PO  Take 1 tablet by mouth 2 (two) times daily.     simvastatin 20 MG tablet  Commonly known as:  ZOCOR  Take 20 mg by mouth daily.  Discharge Instructions: Please refer to Patient Instructions section of EMR for full details.  Patient was counseled important signs and symptoms that should prompt return to medical care, changes in medications, dietary instructions, activity restrictions, and follow up appointments.   Follow-Up Appointments: Patient would like to establish with a new PCP as he cannot easily schedule appointment at the [facility]. Patient's daughter reported that she would contact some private offices today to schedule a follow-up appointment within one week.   Verner Mould, MD 06/24/2015, 1:45 PM PGY-1, [place]

## 2015-06-24 NOTE — Progress Notes (Signed)
OT NOTE  OT notes: Pt with bowel movement during OT session. NO blood noted upon void   Jeri Modena   OTR/L Pager: [phone] Office: [phone] .

## 2015-06-24 NOTE — Progress Notes (Signed)
Patient Discharge: Disposition: Patient discharged to home with daughter. Education: Reviewed all the medications, follow-ups and also discharge instructions, understood and acknowledged. IV: Discontinued before discharge. Telemetry: N/A Transportation: Patient transported in w/c out of the unit accompanied by the staff and daughter. Belongings: Patient took all his belongings with him.

## 2015-06-25 ENCOUNTER — Emergency Department (HOSPITAL_COMMUNITY): Payer: Medicare Other

## 2015-06-25 ENCOUNTER — Encounter (HOSPITAL_COMMUNITY): Payer: Self-pay | Admitting: Emergency Medicine

## 2015-06-25 ENCOUNTER — Emergency Department (HOSPITAL_COMMUNITY)
Admission: EM | Admit: 2015-06-25 | Discharge: 2015-06-25 | Disposition: A | Discharge status: Home / Self Care | Payer: Medicare Other | Attending: Emergency Medicine | Admitting: Emergency Medicine

## 2015-06-25 DIAGNOSIS — Z8546 Personal history of malignant neoplasm of prostate: Secondary | ICD-10-CM | POA: Diagnosis not present

## 2015-06-25 DIAGNOSIS — H539 Unspecified visual disturbance: Secondary | ICD-10-CM

## 2015-06-25 DIAGNOSIS — G301 Alzheimer's disease with late onset: Secondary | ICD-10-CM | POA: Insufficient documentation

## 2015-06-25 DIAGNOSIS — Z7982 Long term (current) use of aspirin: Secondary | ICD-10-CM | POA: Diagnosis not present

## 2015-06-25 DIAGNOSIS — Z79899 Other long term (current) drug therapy: Secondary | ICD-10-CM | POA: Diagnosis not present

## 2015-06-25 DIAGNOSIS — E785 Hyperlipidemia, unspecified: Secondary | ICD-10-CM | POA: Insufficient documentation

## 2015-06-25 DIAGNOSIS — K219 Gastro-esophageal reflux disease without esophagitis: Secondary | ICD-10-CM | POA: Insufficient documentation

## 2015-06-25 DIAGNOSIS — H538 Other visual disturbances: Secondary | ICD-10-CM | POA: Insufficient documentation

## 2015-06-25 DIAGNOSIS — M810 Age-related osteoporosis without current pathological fracture: Secondary | ICD-10-CM | POA: Insufficient documentation

## 2015-06-25 DIAGNOSIS — E039 Hypothyroidism, unspecified: Secondary | ICD-10-CM | POA: Diagnosis not present

## 2015-06-25 DIAGNOSIS — I10 Essential (primary) hypertension: Secondary | ICD-10-CM | POA: Diagnosis not present

## 2015-06-25 LAB — COMPREHENSIVE METABOLIC PANEL
ALBUMIN: 3.7 g/dL (ref 3.5–5.0)
ALT: 29 U/L (ref 17–63)
AST: 31 U/L (ref 15–41)
Alkaline Phosphatase: 72 U/L (ref 38–126)
Anion gap: 8 (ref 5–15)
BUN: 31 mg/dL — AB (ref 6–20)
CHLORIDE: 107 mmol/L (ref 101–111)
CO2: 24 mmol/L (ref 22–32)
CREATININE: 1.37 mg/dL — AB (ref 0.61–1.24)
Calcium: 8.6 mg/dL — ABNORMAL LOW (ref 8.9–10.3)
GFR calc Af Amer: 51 mL/min — ABNORMAL LOW (ref >60)
GFR calc non Af Amer: 44 mL/min — ABNORMAL LOW (ref >60)
GLUCOSE: 110 mg/dL — AB (ref 65–99)
Potassium: 4.5 mmol/L (ref 3.5–5.1)
SODIUM: 139 mmol/L (ref 135–145)
Total Bilirubin: 0.7 mg/dL (ref 0.3–1.2)
Total Protein: 6.4 g/dL — ABNORMAL LOW (ref 6.5–8.1)

## 2015-06-25 LAB — CBC WITH DIFFERENTIAL/PLATELET
Basophils Absolute: 0.1 10*3/uL (ref 0.0–0.1)
Basophils Relative: 1 %
EOS ABS: 0.3 10*3/uL (ref 0.0–0.7)
Eosinophils Relative: 2 %
HCT: 50.1 % (ref 39.0–52.0)
HEMOGLOBIN: 15.2 g/dL (ref 13.0–17.0)
LYMPHS ABS: 2.1 10*3/uL (ref 0.7–4.0)
Lymphocytes Relative: 18 %
MCH: 24.5 pg — AB (ref 26.0–34.0)
MCHC: 30.3 g/dL (ref 30.0–36.0)
MCV: 80.7 fL (ref 78.0–100.0)
MONO ABS: 1.2 10*3/uL — AB (ref 0.1–1.0)
MONOS PCT: 10 %
NEUTROS PCT: 69 %
Neutro Abs: 8 10*3/uL — ABNORMAL HIGH (ref 1.7–7.7)
PLATELETS: 556 10*3/uL — AB (ref 150–400)
RBC: 6.21 MIL/uL — ABNORMAL HIGH (ref 4.22–5.81)
RDW: 17.8 % — ABNORMAL HIGH (ref 11.5–15.5)
WBC: 11.7 10*3/uL — ABNORMAL HIGH (ref 4.0–10.5)

## 2015-06-25 NOTE — ED Provider Notes (Signed)
CSN: [id number]     Arrival date & time 06/25/15  1020 History   First MD Initiated Contact with Patient 06/25/15 1051     Chief Complaint  Patient presents with  . vision changes      (Consider location/radiation/quality/duration/timing/severity/associated sxs/prior Treatment) HPI Patient presents with concern of new vision changes. Patient is here with his daughter who assists with the history of present illness, as the patient has dementia. Level V caveat. Cimino, the patient was in his usual state of health last night, after recent hospitalization, with no new ongoing concerns. The patient awoke several hours ago, with concern for new bilateral vision deficiency. No new pain, lightheadedness, fever, chills, nausea, vomiting. No new medication, diet, activity. Since onset, the vision loss has been persistent. Daughter notes that the patient has a history of macular degeneration.  Past Medical History  Diagnosis Date  . GERD (gastroesophageal reflux disease)   . Hyperlipidemia   . Alzheimer disease   . Macular degeneration   . Cancer [facility])     prostate  . Hypertension   . Thyroid disease   . History of small bowel obstruction 11/17/2013  . Adult hypothyroidism 06/24/2015  . Bright red blood per rectum 06/24/2015  . Essential hypertension 05/02/2015  . Excessive falling 08/08/2014  . H/O malignant neoplasm of prostate 06/24/2015  . History of small bowel obstruction 11/17/2013  . HLD (hyperlipidemia) 06/24/2015  . Late onset Alzheimer's disease without behavioral disturbance 05/02/2015  . Left anterior fascicular block 06/24/2015  . Osteoporosis 06/24/2015  . Spinal stenosis of lumbar region   . Weakness 06/23/2015   Past Surgical History  Procedure Laterality Date  . Prostatectomy     Family History  Problem Relation Age of Onset  .      Social History  Substance Use Topics  . Smoking status: Never Smoker   . Smokeless tobacco: None  . Alcohol Use: No    Review of  Systems  Unable to perform ROS: Dementia      Allergies  Review of patient's allergies indicates no known allergies.  Home Medications   Prior to Admission medications   Medication Sig Start Date End Date Taking? Authorizing Provider  amLODipine (NORVASC) 5 MG tablet Take 5 mg by mouth daily.    Historical Provider, MD  aspirin 81 MG tablet Take 81 mg by mouth daily.    Historical Provider, MD  Calcium Carb-Cholecalciferol (CALCIUM 600 + D PO) Take 1 tablet by mouth 2 (two) times daily.     Historical Provider, MD  Cholecalciferol (D3 ADULT PO) Take 1 tablet by mouth daily.     Historical Provider, MD  galantamine (RAZADYNE) 12 MG tablet Take 1 tablet (12 mg total) by mouth 2 (two) times daily. 06/13/15   Pieter Partridge, DO  levothyroxine (SYNTHROID, LEVOTHROID) 88 MCG tablet Take 88 mcg by mouth daily before breakfast.    Historical Provider, MD  loratadine (CLARITIN) 10 MG tablet Take 10 mg by mouth daily.     Historical Provider, MD  Multiple Vitamins-Minerals (PRESERVISION AREDS 2 PO) Take 1 tablet by mouth 2 (two) times daily.    Historical Provider, MD  naproxen (NAPROSYN) 500 MG tablet Take 500 mg by mouth 2 (two) times daily with a meal.    Historical Provider, MD  Omega-3 Fatty Acids (FISH OIL PO) Take 1,000 mg by mouth 2 (two) times daily.     Historical Provider, MD  omeprazole (PRILOSEC) 20 MG capsule Take 20 mg by mouth daily.  Historical Provider, MD  simvastatin (ZOCOR) 20 MG tablet Take 20 mg by mouth daily.    Historical Provider, MD   BP 138/63 mmHg  Pulse 72  Temp(Src) 97.8 F (36.6 C) (Oral)  Resp 16  SpO2 100% Physical Exam  Constitutional: He is oriented to person, place, and time. He appears well-developed. No distress.  HENT:  Head: Normocephalic and atraumatic.  Eyes: Conjunctivae and EOM are normal.  Pupils are symmetrically very small on exam, with minimal change with light stimuli. Patient has spontaneous extraocular eye motion tracks appropriately   Cardiovascular: Normal rate and regular rhythm.   Pulmonary/Chest: Effort normal. No stridor. No respiratory distress.  Abdominal: He exhibits no distension.  Musculoskeletal: He exhibits no edema.  Neurological: He is alert and oriented to person, place, and time. He displays atrophy. He displays no tremor. No sensory deficit. He exhibits normal muscle tone. He displays no seizure activity.  No asymmetry in strength, coordination, speech is clear, brief, no facial asymmetry  Skin: Skin is warm and dry.  Psychiatric: Cognition and memory are impaired.  Nursing note and vitals reviewed.   ED Course  Procedures (including critical care time) Labs Review Labs Reviewed  COMPREHENSIVE METABOLIC PANEL - Abnormal; Notable for the following:    Glucose, Bld 110 (*)    BUN 31 (*)    Creatinine, Ser 1.37 (*)    Calcium 8.6 (*)    Total Protein 6.4 (*)    GFR calc non Af Amer 44 (*)    GFR calc Af Amer 51 (*)    All other components within normal limits  CBC WITH DIFFERENTIAL/PLATELET - Abnormal; Notable for the following:    WBC 11.7 (*)    RBC 6.21 (*)    MCH 24.5 (*)    RDW 17.8 (*)    Platelets 556 (*)    Neutro Abs 8.0 (*)    Monocytes Absolute 1.2 (*)    All other components within normal limits    Imaging Review Dg Chest 2 View  06/23/2015  CLINICAL DATA:  81 year old male with altered mental status x1 week. EXAM: CHEST  2 VIEW COMPARISON:  None. FINDINGS: Two views of the chest demonstrate emphysematous changes of the lungs. There is no focal consolidation, pleural effusion, or pneumothorax. A 5 mm nodular density in the right lower lung field on the frontal projection likely represents pulmonary nodule and less likely a vessel on end. Direct comparison with prior images if available recommended to evaluate for stability or interval change. Alternatively CT is recommended for further evaluation. Top-normal cardiac silhouette. The aorta is tortuous. The osseous structures are  grossly unremarkable. IMPRESSION: No acute cardiopulmonary process. A 5 mm right lower lung field nodule. Electronically Signed   By: Anner Crete M.D.   On: 06/23/2015 22:08   Mr Brain Wo Contrast (neuro Protocol)  06/25/2015  CLINICAL DATA:  New BILATERAL visual loss. History of macular degeneration. History of prostate cancer. EXAM: MRI HEAD WITHOUT CONTRAST TECHNIQUE: Multiplanar, multiecho pulse sequences of the brain and surrounding structures were obtained without intravenous contrast. COMPARISON:  None. FINDINGS: No evidence for acute infarction, hemorrhage, mass lesion, hydrocephalus, or extra-axial fluid. Generalized atrophy. Moderately advanced chronic microvascular ischemic change. Scattered areas of chronic lacunar infarction. Pituitary, pineal, and cerebellar tonsils unremarkable. No chiasmatic compression. Abnormal appearing upper cervical spine including C2 and C3, with focal areas of marrow replacement suggesting prostate cancer. No pathologic fracture. Minor involvement of the skull base is suspected. No calvarial lesions of significance. Presence  or absence of spinal stenosis or spinal epidural tumor not established on this noncontrast brain MR exam. Flow voids are maintained in the carotid, basilar, and vertebral arteries. Major dural venous sinuses are patent. Negative orbits, sinuses, and mastoids. Scalp soft tissues unremarkable. IMPRESSION: No acute stroke is evident. Atrophy and small vessel disease. Osseous disease from metastatic prostate cancer is suspected in the upper cervical region involving C2 and C3. No pathologic fracture. Electronically Signed   By: Staci Righter M.D.   On: 06/25/2015 12:24   Dg Abd Portable 1v  06/24/2015  CLINICAL DATA:  Constipation. EXAM: PORTABLE ABDOMEN - 1 VIEW COMPARISON:  No prior . FINDINGS: Soft tissue structures are unremarkable. No bowel distention. Moderate stool burden. No free air. Pelvic phleboliths and atherosclerotic vascular  calcifications noted. Surgical clips in the pelvis. Degenerative changes lumbar spine. IMPRESSION: 1. Moderate stool burden.  No bowel distention. 2. Atherosclerotic vascular disease. Electronically Signed   By: Marcello Moores  Register   On: 06/24/2015 07:46   I have personally reviewed and evaluated these images and lab results as part of my medical decision-making.   Per chart review notable for recent hospitalization  1:59 PM All results discussed with the patient, his daughter, and subsequently with our ophthalmologist. Patient will follow-up in the office in 48 hours. MDM  Patient presents with vision changes. Patient is notable history of dementia and macular degeneration. Seemingly, the patient has had decrease in visual capacity since yesterday. Patient is otherwise neurologically intact, awake, alert, though mildly disoriented. Patient's history was discussed with our ophthalmology colleagues, who will follow-up with the patient in the office.   Carmin Muskrat, MD 06/25/15 1400

## 2015-06-25 NOTE — Discharge Instructions (Signed)
As discussed, it is important that you follow up with our ophthamologist for continued management of your condition.  If you develop any new, or concerning changes in your condition, please return to the emergency department immediately.   Visual Disturbances You have had a disturbance in your vision. This may be caused by various conditions, such as:  Migraines. Migraine headaches are often preceded by a disturbance in vision. Blind spots or light flashes are followed by a headache. This type of visual disturbance is temporary. It does not damage the eye.  Glaucoma. This is caused by increased pressure in the eye. Symptoms include haziness, blurred vision, or seeing rainbow colored circles when looking at bright lights. Partial or complete visual loss can occur. You may or may not experience eye pain. Visual loss may be gradual or sudden and is irreversible. Glaucoma is the leading cause of blindness.  Retina problems. Vision will be reduced if the retina becomes detached or if there is a circulation problem as with diabetes, high blood pressure, or a mini-stroke. Symptoms include seeing "floaters," flashes of light, or shadows, as if a curtain has fallen over your eye.  Optic nerve problems. The main nerve in your eye can be damaged by redness, soreness, and swelling (inflammation), poor circulation, drugs, and toxins. It is very important to have a complete exam done by a specialist to determine the exact cause of your eye problem. The specialist may recommend medicines or surgery, depending on the cause of the problem. This can help prevent further loss of vision or reduce the risk of having a stroke. Contact the caregiver to whom you have been referred and arrange for follow-up care right away. SEEK IMMEDIATE MEDICAL CARE IF:   Your vision gets worse.  You develop severe headaches.  You have any weakness or numbness in the face, arms, or legs.  You have any trouble speaking or walking.   This information is not intended to replace advice given to you by your health care provider. Make sure you discuss any questions you have with your health care provider.   Document Released: 08/16/2004 Document Revised: 10/01/2011 Document Reviewed: 12/16/2013 Elsevier Interactive Patient Education [employer].

## 2015-06-25 NOTE — Consult Note (Signed)
Ophthalmology Initial Consult Note  Billy Banks, 81 y.o. male Date of Service:  06/25/2015  Requesting physician: Dr. Carmin Muskrat Information Obtained from: patient Chief Complaint:  Blurry vision OS  HPI/Discussion:  Billy Banks is a 81 y.o. male who presents to the ER with acute-onset painless blurry vision OS, which he noticed upon awakening this AM. He has a POHx of dry AMD, which has been followed at the [place]. He is concerned today that it has converted to wet. He describes the blurry vision as fuzzy and does not specifically describe wavy lines or distortion. He denies flashes, floaters, or curtains. He has no complaints OD. He also denies any jaw pain or fatigue with chewing, headaches, scalp tenderness, or new/worsening arthritis.  Past Ocular Hx:  Dry AMD OU, CE/IOL OU Ocular Meds:  None Family ocular history: Not applicable  Past Medical History  Diagnosis Date  . GERD (gastroesophageal reflux disease)   . Hyperlipidemia   . Alzheimer disease   . Macular degeneration   . Cancer [facility])     prostate  . Hypertension   . Thyroid disease   . History of small bowel obstruction 11/17/2013  . Adult hypothyroidism 06/24/2015  . Bright red blood per rectum 06/24/2015  . Essential hypertension 05/02/2015  . Excessive falling 08/08/2014  . H/O malignant neoplasm of prostate 06/24/2015  . History of small bowel obstruction 11/17/2013  . HLD (hyperlipidemia) 06/24/2015  . Late onset Alzheimer's disease without behavioral disturbance 05/02/2015  . Left anterior fascicular block 06/24/2015  . Osteoporosis 06/24/2015  . Spinal stenosis of lumbar region   . Weakness 06/23/2015   Past Surgical History  Procedure Laterality Date  . Prostatectomy      Prior to Admission Meds: NA   No Known Allergies Social History  Substance Use Topics  . Smoking status: Never Smoker   . Smokeless tobacco: Not on file  . Alcohol Use: No   Family History  Problem Relation Age of Onset   .       ROS: Other than ROS in the HPI, all other systems were negative.  Exam: Temp: 97.8 F (36.6 C) Pulse Rate: 68 BP: 139/75 mmHg Resp: 16 SpO2: 93 %  Visual Acuity:  cc  ph  near OD 20/40  NI  near OS 20/60  NI     OD OS  Confr Vis Fields Full Full (but fuzzy centrally)  EOM (Primary) Full Full  Lids/Lashes WNL WNL  Conjunctiva - Bulbar White, quiet White, quiet  Conjunctiva - Palpebral               White, quiet White, quiet  Adnexa  WNL WNL  Pupils  3 -->2, somewhat sluggish, no rAPD 3 -->2, somewhat sluggish, no rAPD  Cornea  Clear Clear  Anterior Chamber Formed Formed  Lens:  IOL IOL  IOP 12 12  Fundus - Dilated? Yes   Optic Disc - C:D Ratio 0.5, no pallor or edema 0.5, no pallor or edema  Post Seg:  Retina                    Vessels Normal caliber Normal caliber                  Vitreous  Weiss ring Weiss ring                  Macula Profound geographic atrophy and drusen Profound geographic atrophy and drusen, no obvious edema with 28D  Periphery WNL WNL   Neuro:  Oriented to person, place, and time:  Yes Psychiatric:  Mood and Affect Appropriate:  Yes  Labs/imaging: NA  A/P:  81 y.o. male with known POHx of ARMD who presents with acute-onset blurry vision OS with profound geographic atrophy OU and an otherwise normal exam consistent with worsening ARMD.  1) Dry ARMD OD - severe 2) Wet ARMD OS - severe - Concern for wet ARMD OS, although difficult to tell at bedside - Pt is concerned it will take months to get seen at the [place]. - He will need OCT macula and likely fluorescein angiography in the outpatient setting. - No acute treatment indicated today in the ER.  I will refer him to Dr. Deloria Lair for further evaluation. Please feel free to contact me at [phone] with any questions or concerns.  Doran Stabler, MD 06/25/2015, 3:01 PM [address] [place], [address] [place], [place] [address]

## 2015-06-25 NOTE — ED Notes (Signed)
Per daughter, states he woke up and told her he could not read anything-states history of macular dengeneration-daughter states she "chescked him for stroke" and observe no symptoms

## 2015-06-25 NOTE — ED Notes (Signed)
Patient has returned from MRI

## 2015-06-25 NOTE — ED Notes (Signed)
Patient going to MRI at this time.

## 2015-08-24 ENCOUNTER — Other Ambulatory Visit: Payer: Self-pay

## 2015-08-24 MED ORDER — GALANTAMINE HYDROBROMIDE 12 MG PO TABS: 12.0000 mg | ORAL_TABLET | Freq: Two times a day (BID) | ORAL | Status: DC

## 2015-08-24 NOTE — Telephone Encounter (Signed)
Last OV: 05/04/15 Next OV: 12/05/15

## 2015-10-20 ENCOUNTER — Encounter (HOSPITAL_COMMUNITY): Payer: Self-pay

## 2015-10-20 ENCOUNTER — Emergency Department (HOSPITAL_COMMUNITY)
Admission: EM | Admit: 2015-10-20 | Discharge: 2015-10-20 | Disposition: A | Discharge status: Home / Self Care | Payer: Medicare Other | Attending: Emergency Medicine | Admitting: Emergency Medicine

## 2015-10-20 DIAGNOSIS — Z87891 Personal history of nicotine dependence: Secondary | ICD-10-CM | POA: Diagnosis not present

## 2015-10-20 DIAGNOSIS — Z7982 Long term (current) use of aspirin: Secondary | ICD-10-CM | POA: Insufficient documentation

## 2015-10-20 DIAGNOSIS — K625 Hemorrhage of anus and rectum: Secondary | ICD-10-CM | POA: Diagnosis not present

## 2015-10-20 DIAGNOSIS — Z79899 Other long term (current) drug therapy: Secondary | ICD-10-CM | POA: Insufficient documentation

## 2015-10-20 DIAGNOSIS — F0280 Dementia in other diseases classified elsewhere without behavioral disturbance: Secondary | ICD-10-CM | POA: Diagnosis not present

## 2015-10-20 DIAGNOSIS — Z8546 Personal history of malignant neoplasm of prostate: Secondary | ICD-10-CM | POA: Insufficient documentation

## 2015-10-20 DIAGNOSIS — G309 Alzheimer's disease, unspecified: Secondary | ICD-10-CM | POA: Diagnosis not present

## 2015-10-20 DIAGNOSIS — M810 Age-related osteoporosis without current pathological fracture: Secondary | ICD-10-CM | POA: Diagnosis not present

## 2015-10-20 DIAGNOSIS — E785 Hyperlipidemia, unspecified: Secondary | ICD-10-CM | POA: Diagnosis not present

## 2015-10-20 DIAGNOSIS — E039 Hypothyroidism, unspecified: Secondary | ICD-10-CM | POA: Diagnosis not present

## 2015-10-20 DIAGNOSIS — I10 Essential (primary) hypertension: Secondary | ICD-10-CM | POA: Insufficient documentation

## 2015-10-20 LAB — CBC
HCT: 49.4 % (ref 39.0–52.0)
HEMOGLOBIN: 14.6 g/dL (ref 13.0–17.0)
MCH: 22.2 pg — ABNORMAL LOW (ref 26.0–34.0)
MCHC: 29.6 g/dL — ABNORMAL LOW (ref 30.0–36.0)
MCV: 75 fL — AB (ref 78.0–100.0)
PLATELETS: 745 10*3/uL — AB (ref 150–400)
RBC: 6.59 MIL/uL — AB (ref 4.22–5.81)
RDW: 19.3 % — ABNORMAL HIGH (ref 11.5–15.5)
WBC: 15 10*3/uL — AB (ref 4.0–10.5)

## 2015-10-20 LAB — COMPREHENSIVE METABOLIC PANEL
ALT: 23 U/L (ref 17–63)
ANION GAP: 9 (ref 5–15)
AST: 29 U/L (ref 15–41)
Albumin: 3.9 g/dL (ref 3.5–5.0)
Alkaline Phosphatase: 75 U/L (ref 38–126)
BUN: 29 mg/dL — ABNORMAL HIGH (ref 6–20)
CHLORIDE: 106 mmol/L (ref 101–111)
CO2: 24 mmol/L (ref 22–32)
CREATININE: 1.19 mg/dL (ref 0.61–1.24)
Calcium: 8.7 mg/dL — ABNORMAL LOW (ref 8.9–10.3)
GFR, EST NON AFRICAN AMERICAN: 52 mL/min — AB (ref >60)
Glucose, Bld: 106 mg/dL — ABNORMAL HIGH (ref 65–99)
Potassium: 4.8 mmol/L (ref 3.5–5.1)
Sodium: 139 mmol/L (ref 135–145)
Total Bilirubin: 0.4 mg/dL (ref 0.3–1.2)
Total Protein: 7.2 g/dL (ref 6.5–8.1)

## 2015-10-20 LAB — POC OCCULT BLOOD, ED: Fecal Occult Bld: POSITIVE — AB

## 2015-10-20 LAB — ABO/RH: ABO/RH(D): A POS

## 2015-10-20 MED ORDER — DOCUSATE SODIUM 100 MG PO CAPS: 100.0000 mg | ORAL_CAPSULE | Freq: Two times a day (BID) | ORAL | Status: DC

## 2015-10-20 NOTE — ED Provider Notes (Signed)
CSN: [id number]     Arrival date & time 10/20/15  1631 History   First MD Initiated Contact with Patient 10/20/15 1733     Chief Complaint  Patient presents with  . Rectal Bleeding     (Consider location/radiation/quality/duration/timing/severity/associated sxs/prior Treatment) HPI Patient presents to the emergency department with an episode of bright red blood per rectum earlier today.  The patient states that he went to use the bathroom, when he wiped she noticed bright red blood on the toilet paper.  Patient states that he did not have any further episodes, but noticed some mild blood staining in his underwear.  Patient states that he is not have any vomiting of blood.  Patient states that he has had a previous episode of this in the past without significant abnormality noted on further testing.The patient denies chest pain, shortness of breath, headache,blurred vision, neck pain, fever, cough, weakness, numbness, dizziness, anorexia, edema, abdominal pain, nausea, vomiting, diarrhea, rash, back pain, dysuria, hematemesisl, near syncope, or syncope. Past Medical History  Diagnosis Date  . GERD (gastroesophageal reflux disease)   . Hyperlipidemia   . Alzheimer disease   . Macular degeneration   . Cancer [facility])     prostate  . Hypertension   . Thyroid disease   . History of small bowel obstruction 11/17/2013  . Adult hypothyroidism 06/24/2015  . Bright red blood per rectum 06/24/2015  . Essential hypertension 05/02/2015  . Excessive falling 08/08/2014  . H/O malignant neoplasm of prostate 06/24/2015  . History of small bowel obstruction 11/17/2013  . HLD (hyperlipidemia) 06/24/2015  . Late onset Alzheimer's disease without behavioral disturbance 05/02/2015  . Left anterior fascicular block 06/24/2015  . Osteoporosis 06/24/2015  . Spinal stenosis of lumbar region   . Weakness 06/23/2015   Past Surgical History  Procedure Laterality Date  . Prostatectomy    . Eye surgery    . Knee  replacement surgery Bilateral    Family History  Problem Relation Age of Onset  . Adopted: Yes   Social History  Substance Use Topics  . Smoking status: Former Research scientist (life sciences)  . Smokeless tobacco: Never Used  . Alcohol Use: No    Review of Systems  All other systems negative except as documented in the HPI. All pertinent positives and negatives as reviewed in the HPI.  Allergies  Review of patient's allergies indicates no known allergies.  Home Medications   Prior to Admission medications   Medication Sig Start Date End Date Taking? Authorizing Provider  acetaminophen (TYLENOL) 325 MG tablet Take 650 mg by mouth every 8 (eight) hours as needed for mild pain, moderate pain or headache.   Yes Historical Provider, MD  amLODipine (NORVASC) 5 MG tablet Take 5 mg by mouth daily.   Yes Historical Provider, MD  aspirin 81 MG tablet Take 81 mg by mouth daily.   Yes Historical Provider, MD  Calcium Carb-Cholecalciferol (CALCIUM 600 + D PO) Take 1 tablet by mouth 2 (two) times daily.    Yes Historical Provider, MD  galantamine (RAZADYNE) 12 MG tablet Take 1 tablet (12 mg total) by mouth 2 (two) times daily. 08/24/15  Yes Adam Telford Nab, DO  levothyroxine (SYNTHROID, LEVOTHROID) 88 MCG tablet Take 88 mcg by mouth daily before breakfast.   Yes Historical Provider, MD  Multiple Vitamins-Minerals (PRESERVISION AREDS 2 PO) Take 1 tablet by mouth 2 (two) times daily.   Yes Historical Provider, MD  Omega-3 Fatty Acids (FISH OIL PO) Take 1,000 mg by mouth 2 (two) times  daily.    Yes Historical Provider, MD   BP 140/74 mmHg  Pulse 78  Temp(Src) 98.1 F (36.7 C) (Oral)  Resp 18  SpO2 90% Physical Exam  Constitutional: He is oriented to person, place, and time. He appears well-developed and well-nourished. No distress.  HENT:  Head: Normocephalic and atraumatic.  Mouth/Throat: Oropharynx is clear and moist.  Eyes: Pupils are equal, round, and reactive to light.  Neck: Normal range of motion. Neck supple.   Cardiovascular: Normal rate, regular rhythm and normal heart sounds.  Exam reveals no gallop and no friction rub.   No murmur heard. Pulmonary/Chest: Effort normal and breath sounds normal. No respiratory distress. He has no wheezes.  Abdominal: Soft. Bowel sounds are normal. He exhibits no distension. There is no tenderness.  Genitourinary: Prostate normal. Rectal exam shows no external hemorrhoid, no fissure, no tenderness and anal tone normal. Guaiac positive stool.  Neurological: He is alert and oriented to person, place, and time. He exhibits normal muscle tone. Coordination normal.  Skin: Skin is warm and dry. No rash noted. No erythema.  Psychiatric: He has a normal mood and affect. His behavior is normal.  Nursing note and vitals reviewed.   ED Course  Procedures (including critical care time) Labs Review Labs Reviewed  COMPREHENSIVE METABOLIC PANEL - Abnormal; Notable for the following:    Glucose, Bld 106 (*)    BUN 29 (*)    Calcium 8.7 (*)    GFR calc non Af Amer 52 (*)    All other components within normal limits  CBC - Abnormal; Notable for the following:    WBC 15.0 (*)    RBC 6.59 (*)    MCV 75.0 (*)    MCH 22.2 (*)    MCHC 29.6 (*)    RDW 19.3 (*)    Platelets 745 (*)    All other components within normal limits  POC OCCULT BLOOD, ED - Abnormal; Notable for the following:    Fecal Occult Bld POSITIVE (*)    All other components within normal limits  TYPE AND SCREEN  ABO/RH    Imaging Review No results found. I have personally reviewed and evaluated these images and lab results as part of my medical decision-making.  Patient is referred to GI for further evaluation and care.  The daughter states that after his previous admission for this.  They did not referred to GI for any further testing.  The patient has not had any further bleeding.  I advised him to return here as needed.  Patient's hemoglobin is stable.  Vital signs of also been stable.  Patient is  not orthostatic     Dalia Heading, PA-C 10/21/15 0158  Davonna Belling, MD 10/22/15 [phone]

## 2015-10-20 NOTE — Discharge Instructions (Signed)
Return here as needed. Follow up with the GI doctor provided.  °

## 2015-10-20 NOTE — ED Notes (Signed)
Adult care center called daughter today and reported a quarter-size area of bright red blood on tissue paper today. Patient c/o rectal pain.

## 2015-10-21 LAB — TYPE AND SCREEN
ABO/RH(D): A POS
Antibody Screen: NEGATIVE

## 2015-10-31 ENCOUNTER — Ambulatory Visit: Payer: Self-pay | Admitting: Neurology

## 2015-11-19 ENCOUNTER — Emergency Department (HOSPITAL_COMMUNITY)
Admission: EM | Admit: 2015-11-19 | Discharge: 2015-11-19 | Disposition: A | Discharge status: Home / Self Care | Payer: Medicare Other | Attending: Emergency Medicine | Admitting: Emergency Medicine

## 2015-11-19 ENCOUNTER — Encounter (HOSPITAL_COMMUNITY): Payer: Self-pay | Admitting: Emergency Medicine

## 2015-11-19 DIAGNOSIS — Z7982 Long term (current) use of aspirin: Secondary | ICD-10-CM | POA: Insufficient documentation

## 2015-11-19 DIAGNOSIS — I10 Essential (primary) hypertension: Secondary | ICD-10-CM | POA: Diagnosis not present

## 2015-11-19 DIAGNOSIS — Z87891 Personal history of nicotine dependence: Secondary | ICD-10-CM | POA: Insufficient documentation

## 2015-11-19 DIAGNOSIS — M810 Age-related osteoporosis without current pathological fracture: Secondary | ICD-10-CM | POA: Diagnosis not present

## 2015-11-19 DIAGNOSIS — W01198A Fall on same level from slipping, tripping and stumbling with subsequent striking against other object, initial encounter: Secondary | ICD-10-CM | POA: Diagnosis not present

## 2015-11-19 DIAGNOSIS — Z79899 Other long term (current) drug therapy: Secondary | ICD-10-CM | POA: Insufficient documentation

## 2015-11-19 DIAGNOSIS — E785 Hyperlipidemia, unspecified: Secondary | ICD-10-CM | POA: Diagnosis not present

## 2015-11-19 DIAGNOSIS — W19XXXA Unspecified fall, initial encounter: Secondary | ICD-10-CM

## 2015-11-19 DIAGNOSIS — Y92009 Unspecified place in unspecified non-institutional (private) residence as the place of occurrence of the external cause: Secondary | ICD-10-CM | POA: Insufficient documentation

## 2015-11-19 DIAGNOSIS — Y9389 Activity, other specified: Secondary | ICD-10-CM | POA: Diagnosis not present

## 2015-11-19 DIAGNOSIS — Y998 Other external cause status: Secondary | ICD-10-CM | POA: Insufficient documentation

## 2015-11-19 DIAGNOSIS — G309 Alzheimer's disease, unspecified: Secondary | ICD-10-CM | POA: Diagnosis not present

## 2015-11-19 DIAGNOSIS — Z8546 Personal history of malignant neoplasm of prostate: Secondary | ICD-10-CM | POA: Insufficient documentation

## 2015-11-19 DIAGNOSIS — S0990XA Unspecified injury of head, initial encounter: Secondary | ICD-10-CM | POA: Diagnosis present

## 2015-11-19 DIAGNOSIS — E039 Hypothyroidism, unspecified: Secondary | ICD-10-CM | POA: Diagnosis not present

## 2015-11-19 DIAGNOSIS — K219 Gastro-esophageal reflux disease without esophagitis: Secondary | ICD-10-CM | POA: Insufficient documentation

## 2015-11-19 NOTE — ED Provider Notes (Signed)
CSN: [id number]     Arrival date & time 11/19/15  1659 History   First MD Initiated Contact with Patient 11/19/15 1705     Chief Complaint  Patient presents with  . Fall     HPI Presents to the emergency department after a fall today. Minor head injury. ASA only. No HA at this time. Hx of osteoporosis and pt fell on both of his knees so his daughter was concerned that he could have fractured his knees. Has ambulated since the fall. No neck pain. No knee pain. Able to bend knees in bed without complaints     Past Medical History  Diagnosis Date  . GERD (gastroesophageal reflux disease)   . Hyperlipidemia   . Alzheimer disease   . Macular degeneration   . Cancer [facility])     prostate  . Hypertension   . Thyroid disease   . History of small bowel obstruction 11/17/2013  . Adult hypothyroidism 06/24/2015  . Bright red blood per rectum 06/24/2015  . Essential hypertension 05/02/2015  . Excessive falling 08/08/2014  . H/O malignant neoplasm of prostate 06/24/2015  . History of small bowel obstruction 11/17/2013  . HLD (hyperlipidemia) 06/24/2015  . Late onset Alzheimer's disease without behavioral disturbance 05/02/2015  . Left anterior fascicular block 06/24/2015  . Osteoporosis 06/24/2015  . Spinal stenosis of lumbar region   . Weakness 06/23/2015   Past Surgical History  Procedure Laterality Date  . Prostatectomy    . Eye surgery    . Knee replacement surgery Bilateral    Family History  Problem Relation Age of Onset  . Adopted: Yes   Social History  Substance Use Topics  . Smoking status: Former Research scientist (life sciences)  . Smokeless tobacco: Never Used  . Alcohol Use: No    Review of Systems  All other systems reviewed and are negative.     Allergies  Review of patient's allergies indicates no known allergies.  Home Medications   Prior to Admission medications   Medication Sig Start Date End Date Taking? Authorizing Provider  acetaminophen (TYLENOL) 325 MG tablet Take 650 mg by  mouth every 8 (eight) hours as needed for mild pain, moderate pain or headache.    Historical Provider, MD  amLODipine (NORVASC) 5 MG tablet Take 5 mg by mouth daily.    Historical Provider, MD  aspirin 81 MG tablet Take 81 mg by mouth daily.    Historical Provider, MD  Calcium Carb-Cholecalciferol (CALCIUM 600 + D PO) Take 1 tablet by mouth 2 (two) times daily.     Historical Provider, MD  docusate sodium (COLACE) 100 MG capsule Take 1 capsule (100 mg total) by mouth every 12 (twelve) hours. 10/20/15   Dalia Heading, PA-C  galantamine (RAZADYNE) 12 MG tablet Take 1 tablet (12 mg total) by mouth 2 (two) times daily. 08/24/15   Pieter Partridge, DO  levothyroxine (SYNTHROID, LEVOTHROID) 88 MCG tablet Take 88 mcg by mouth daily before breakfast.    Historical Provider, MD  Multiple Vitamins-Minerals (PRESERVISION AREDS 2 PO) Take 1 tablet by mouth 2 (two) times daily.    Historical Provider, MD  Omega-3 Fatty Acids (FISH OIL PO) Take 1,000 mg by mouth 2 (two) times daily.     Historical Provider, MD   BP 111/96 mmHg  Pulse 90  Temp(Src) 97.8 F (36.6 C) (Oral)  Resp 19  SpO2 95% Physical Exam Physical Exam  Constitutional: He is oriented to person, place, and time. He appears well-developed and well-nourished.  HENT:  Head: Normocephalic and atraumatic.  No scalp hematoma, abrasion, or laceration  Eyes: EOM are normal.  Neck: Normal range of motion. Neck supple.  Pulmonary/Chest: Effort normal.  Abdominal: He exhibits no distension.  Musculoskeletal: Normal range of motion.  Full ROM of bilateral knees. No knee swelling. No bruising of his knees  Neurological: He is alert and oriented to person, place, and time.  Psychiatric: He has a normal mood and affect.  Nursing note and vitals reviewed.  ED Course  Procedures (including critical care time) Labs Review Labs Reviewed - No data to display  Imaging Review No results found. I have personally reviewed and evaluated these images and  lab results as part of my medical decision-making.   EKG Interpretation None      MDM   Final diagnoses:  Fall, initial encounter  Minor head injury, initial encounter    Well appearing. No indication for imaging of head or knees    Jola Schmidt, MD 11/19/15 1756

## 2015-11-19 NOTE — ED Notes (Signed)
Pt from home via EMS- Per EMS- pt had unwitnessed fall today. Pt sts that his remote control fell and hit the R side of head on dresser. Fire found pt on his knees. Pt denies head, neck or back pain. Pt had no LOC. Pt has hx of dementia, but is A&O x4 per EMS and family. EMS reports no injury and pt denies pain

## 2015-11-19 NOTE — ED Notes (Signed)
Bed: [id number] Expected date: 11/19/15 Expected time: 4:56 PM Means of arrival:  Comments: Fall

## 2015-11-19 NOTE — Discharge Instructions (Signed)
Head Injury, Adult °You have a head injury. Headaches and throwing up (vomiting) are common after a head injury. It should be easy to wake up from sleeping. Sometimes you must stay in the hospital. Most problems happen within the first 24 hours. Side effects may occur up to 7-10 days after the injury.  °WHAT ARE THE TYPES OF HEAD INJURIES? °Head injuries can be as minor as a bump. Some head injuries can be more severe. More severe head injuries include: °· A jarring injury to the brain (concussion). °· A bruise of the brain (contusion). This mean there is bleeding in the brain that can cause swelling. °· A cracked skull (skull fracture). °· Bleeding in the brain that collects, clots, and forms a bump (hematoma). °WHEN SHOULD I GET HELP RIGHT AWAY?  °· You are confused or sleepy. °· You cannot be woken up. °· You feel sick to your stomach (nauseous) or keep throwing up (vomiting). °· Your dizziness or unsteadiness is getting worse. °· You have very bad, lasting headaches that are not helped by medicine. Take medicines only as told by your doctor. °· You cannot use your arms or legs like normal. °· You cannot walk. °· You notice changes in the black spots in the center of the colored part of your eye (pupil). °· You have clear or bloody fluid coming from your nose or ears. °· You have trouble seeing. °During the next 24 hours after the injury, you must stay with someone who can watch you. This person should get help right away (call 911 in the [place] if you start to shake and are not able to control it (have seizures), you pass out, or you are unable to wake up. °HOW CAN I PREVENT A HEAD INJURY IN THE FUTURE? °· Wear seat belts. °· Wear a helmet while bike riding and playing sports like football. °· Stay away from dangerous activities around the house. °WHEN CAN I RETURN TO NORMAL ACTIVITIES AND ATHLETICS? °See your doctor before doing these activities. You should not do normal activities or play contact sports until 1  week after the following symptoms have stopped: °· Headache that does not go away. °· Dizziness. °· Poor attention. °· Confusion. °· Memory problems. °· Sickness to your stomach or throwing up. °· Tiredness. °· Fussiness. °· Bothered by bright lights or loud noises. °· Anxiousness or depression. °· Restless sleep. °MAKE SURE YOU:  °· Understand these instructions. °· Will watch your condition. °· Will get help right away if you are not doing well or get worse. °  °This information is not intended to replace advice given to you by your health care provider. Make sure you discuss any questions you have with your health care provider. °  °Document Released: 06/21/2008 Document Revised: 07/30/2014 Document Reviewed: 03/16/2013 °Elsevier Interactive Patient Education ©2016 [employer] ° °

## 2015-12-05 ENCOUNTER — Ambulatory Visit (INDEPENDENT_AMBULATORY_CARE_PROVIDER_SITE_OTHER): Payer: Medicare Other | Admitting: Neurology

## 2015-12-05 ENCOUNTER — Encounter: Payer: Self-pay | Admitting: Neurology

## 2015-12-05 VITALS — BP 144/88 | HR 74 | Ht 67.0 in

## 2015-12-05 DIAGNOSIS — F0280 Dementia in other diseases classified elsewhere without behavioral disturbance: Secondary | ICD-10-CM

## 2015-12-05 DIAGNOSIS — I10 Essential (primary) hypertension: Secondary | ICD-10-CM | POA: Diagnosis not present

## 2015-12-05 DIAGNOSIS — G301 Alzheimer's disease with late onset: Secondary | ICD-10-CM

## 2015-12-05 MED ORDER — MEMANTINE HCL ER 7 & 14 & 21 &28 MG PO CP24: ORAL_CAPSULE | ORAL | Status: DC

## 2015-12-05 NOTE — Patient Instructions (Addendum)
1.  Continue galantamine 12mg  twice daily 2.  We will start memantine ER (Namenda XR).  Take as directed.  Then call for refill 3.  Follow up in 8 months.

## 2015-12-05 NOTE — Progress Notes (Signed)
NEUROLOGY FOLLOW UP OFFICE NOTE  Billy Banks [id number]  HISTORY OF PRESENT ILLNESS: Billy Banks is a 82 year old right-handed man with hypertension, hypothyroidism, dementia, lumbar stenosis and history of prostate cancer who follows up for dementia and falls.  He is accompanied by his daughter who provides some history.  UPDATE: He is currently taking galantamine 12mg  twice daily.  He is tolerating it.  Overall he is doing well.  His short term memory has gotten a little worse, such as regarding day of week, but he has more clear days.  He has no change in behavior or personality.  HISTORY: For the last 2.5 years, he has had a gradual progression of recurrent falls.  He denies lightheadedness, vertigo, slurred speech, bowel or bladder incontinence, headache or visual disturbance.  When he is walking, he sometimes suddenly feels shaky and his legs give out.  He often falls to the left side.  It occurs sporadically.  He has chronic back pain but no pain down the legs.  He denies numbness in the feet.   CT of the head from 08/08/14 showed mild to moderate chronic small vessel disease without ventriculomegaly.    CT of cervical spine showed severe degenerative disc disease from C3-4 through C6-7 levels.  Most recently, he was in the hospital in February for recurrent falls.  Heart rate reportedly fluctuated.  EKG showed NSR with first degree heart block.  Ultimately, it was not found to be due to a cardiac etiology, orthostatic hypotension or hypoglycemia.  MRI of the lumbar spine performed in February showed multilevel spinal stenosis at L2-3 and L3-4.    He has had falls where he was on the ground for up to 3 hours because there was nobody around.  He uses a walker but will forget to sit down in the seat when he begins to feel shaky.  He has since moved in with his daughter and he has not had falls.  For 2-3 hours, he may be home alone.  However, during this time, he remains in his small  room where the toilet is just 4 or 5 steps away.    He lives with his daughter.  He began to be forgetful a few years ago.  Doctors reportedly told him that he was having mini-strokes.  It appears to be a gradual decline.  He has word-finding difficulties.  When he has trouble communicating with others, he gets irritated and angry.  His daughter has to administer his medications.  He is resistant at times to bathe.  He uses the toilet by himself.  He sometimes has mild urinary incontinence.  He sleeps often during the day.  Sometimes when he wakes up in the middle of the night, he thinks it is daylight.  He forgets names of people.  He has mixed up his daughters.  He exhibits signs of executive dysfunction and dressing apraxia as well.  When he goes to appointments with his daughter, he is not sure where he is.  He had reduced appetite.  He was adopted, so family history of dementia is unknown.  He was previously on Aricept, which was discontinued due to diarrhea.  PAST MEDICAL HISTORY: Past Medical History  Diagnosis Date  . GERD (gastroesophageal reflux disease)   . Hyperlipidemia   . Alzheimer disease   . Macular degeneration   . Cancer [facility])     prostate  . Hypertension   . Thyroid disease   . History of small  bowel obstruction 11/17/2013  . Adult hypothyroidism 06/24/2015  . Bright red blood per rectum 06/24/2015  . Essential hypertension 05/02/2015  . Excessive falling 08/08/2014  . H/O malignant neoplasm of prostate 06/24/2015  . History of small bowel obstruction 11/17/2013  . HLD (hyperlipidemia) 06/24/2015  . Late onset Alzheimer's disease without behavioral disturbance 05/02/2015  . Left anterior fascicular block 06/24/2015  . Osteoporosis 06/24/2015  . Spinal stenosis of lumbar region   . Weakness 06/23/2015    MEDICATIONS: Current Outpatient Prescriptions on File Prior to Visit  Medication Sig Dispense Refill  . acetaminophen (TYLENOL) 325 MG tablet Take 650 mg by mouth every 8  (eight) hours as needed for mild pain, moderate pain or headache.    Marland Kitchen amLODipine (NORVASC) 5 MG tablet Take 5 mg by mouth daily.    Marland Kitchen aspirin 81 MG tablet Take 81 mg by mouth daily.    . Calcium Carb-Cholecalciferol (CALCIUM 600 + D PO) Take 1 tablet by mouth 2 (two) times daily.     Marland Kitchen galantamine (RAZADYNE) 12 MG tablet Take 1 tablet (12 mg total) by mouth 2 (two) times daily. 180 tablet 1  . levothyroxine (SYNTHROID, LEVOTHROID) 88 MCG tablet Take 88 mcg by mouth daily before breakfast.    . Multiple Vitamins-Minerals (PRESERVISION AREDS 2 PO) Take 1 tablet by mouth 2 (two) times daily.    . Omega-3 Fatty Acids (FISH OIL PO) Take 1,000 mg by mouth 2 (two) times daily.      No current facility-administered medications on file prior to visit.    ALLERGIES: No Known Allergies  FAMILY HISTORY: Family History  Problem Relation Age of Onset  . Adopted: Yes    SOCIAL HISTORY: Social History   Social History  . Marital Status: Widowed    Spouse Name: N/A  . Number of Children: N/A  . Years of Education: N/A   Occupational History  . Not on file.   Social History Main Topics  . Smoking status: Former Research scientist (life sciences)  . Smokeless tobacco: Never Used  . Alcohol Use: No  . Drug Use: No  . Sexual Activity: No   Other Topics Concern  . Not on file   Social History Narrative   ** Merged History Encounter **        REVIEW OF SYSTEMS: Constitutional: No fevers, chills, or sweats, no generalized fatigue, change in appetite Eyes: No visual changes, double vision, eye pain Ear, nose and throat: No hearing loss, ear pain, nasal congestion, sore throat Cardiovascular: No chest pain, palpitations Respiratory:  No shortness of breath at rest or with exertion, wheezes GastrointestinaI: No nausea, vomiting, diarrhea, abdominal pain, fecal incontinence Genitourinary:  No dysuria, urinary retention or frequency Musculoskeletal:  No neck pain, back pain Integumentary: No rash, pruritus, skin  lesions Neurological: as above Psychiatric: No depression, insomnia, anxiety Endocrine: No palpitations, fatigue, diaphoresis, mood swings, change in appetite, change in weight, increased thirst Hematologic/Lymphatic:  No purpura, petechiae. Allergic/Immunologic: no itchy/runny eyes, nasal congestion, recent allergic reactions, rashes  PHYSICAL EXAM: Filed Vitals:   12/05/15 1511  BP: 144/88  Pulse: 74   General: No acute distress.  Patient appears well-groomed.  normal body habitus. Head:  Normocephalic/atraumatic Eyes:  Fundi examined but not visualized Neck: supple, no paraspinal tenderness, full range of motion Heart:  Regular rate and rhythm Lungs:  Clear to auscultation bilaterally Back: No paraspinal tenderness Neurological Exam: alert and oriented to person, place, and time. Attention span and concentration intact, recent and remote memory intact, fund of  knowledge intact.  Speech fluent and not dysarthric, language intact.   Montreal Cognitive Assessment  12/05/2015  Visuospatial/ Executive (0/5) 1  Naming (0/3) 2  Attention: Read list of digits (0/2) 2  Attention: Read list of letters (0/1) 1  Attention: Serial 7 subtraction starting at 100 (0/3) 2  Language: Repeat phrase (0/2) 2  Language : Fluency (0/1) 1  Abstraction (0/2) 2  Delayed Recall (0/5) 0  Orientation (0/6) 5  Total 18  Adjusted Score (based on education) 18   CN II-XII intact.  Bulk and tone normal.  Muscle strength 5/5 throughout.  Sensation to light touch intact.  Deep tendon reflexes 1+ in upper extremities and absent in lower extremities.  Finger to nose intact.  Unable to walk without assistance.  IMPRESSION: Alzheimer's disease  PLAN: 1.  Will add Namenda XR to galantamine, titrating to 28mg  daily 2.  Continue participation at the [facility] 3.  BP slightly elevated.  Follow up with PCP. 4.  Follow up in 8 months.  22 minutes spent face to face with patient, over 50% spent discussing  management.  Metta Clines, DO  CC: Nicolette Bang

## 2015-12-06 NOTE — Progress Notes (Signed)
Chart forwarded.  

## 2015-12-25 ENCOUNTER — Emergency Department (HOSPITAL_COMMUNITY): Payer: Medicare Other

## 2015-12-25 ENCOUNTER — Inpatient Hospital Stay (HOSPITAL_COMMUNITY)
Admission: EM | Admit: 2015-12-25 | Discharge: 2015-12-28 | DRG: 392 | Disposition: A | Discharge status: Home / Self Care | Payer: Medicare Other | Attending: Internal Medicine | Admitting: Internal Medicine

## 2015-12-25 ENCOUNTER — Encounter (HOSPITAL_COMMUNITY): Payer: Self-pay | Admitting: Emergency Medicine

## 2015-12-25 DIAGNOSIS — B348 Other viral infections of unspecified site: Secondary | ICD-10-CM | POA: Diagnosis present

## 2015-12-25 DIAGNOSIS — I10 Essential (primary) hypertension: Secondary | ICD-10-CM

## 2015-12-25 DIAGNOSIS — E039 Hypothyroidism, unspecified: Secondary | ICD-10-CM | POA: Diagnosis not present

## 2015-12-25 DIAGNOSIS — E860 Dehydration: Secondary | ICD-10-CM

## 2015-12-25 DIAGNOSIS — Z7982 Long term (current) use of aspirin: Secondary | ICD-10-CM

## 2015-12-25 DIAGNOSIS — K219 Gastro-esophageal reflux disease without esophagitis: Secondary | ICD-10-CM | POA: Diagnosis present

## 2015-12-25 DIAGNOSIS — Z87891 Personal history of nicotine dependence: Secondary | ICD-10-CM

## 2015-12-25 DIAGNOSIS — G301 Alzheimer's disease with late onset: Secondary | ICD-10-CM

## 2015-12-25 DIAGNOSIS — R197 Diarrhea, unspecified: Secondary | ICD-10-CM | POA: Diagnosis not present

## 2015-12-25 DIAGNOSIS — M810 Age-related osteoporosis without current pathological fracture: Secondary | ICD-10-CM | POA: Diagnosis present

## 2015-12-25 DIAGNOSIS — Z79899 Other long term (current) drug therapy: Secondary | ICD-10-CM

## 2015-12-25 DIAGNOSIS — H3530 Unspecified macular degeneration: Secondary | ICD-10-CM | POA: Diagnosis present

## 2015-12-25 DIAGNOSIS — Z96653 Presence of artificial knee joint, bilateral: Secondary | ICD-10-CM | POA: Diagnosis present

## 2015-12-25 DIAGNOSIS — F0280 Dementia in other diseases classified elsewhere without behavioral disturbance: Secondary | ICD-10-CM | POA: Diagnosis present

## 2015-12-25 DIAGNOSIS — R748 Abnormal levels of other serum enzymes: Secondary | ICD-10-CM

## 2015-12-25 DIAGNOSIS — D72829 Elevated white blood cell count, unspecified: Secondary | ICD-10-CM | POA: Diagnosis present

## 2015-12-25 DIAGNOSIS — E611 Iron deficiency: Secondary | ICD-10-CM | POA: Diagnosis present

## 2015-12-25 DIAGNOSIS — R7989 Other specified abnormal findings of blood chemistry: Secondary | ICD-10-CM | POA: Diagnosis present

## 2015-12-25 DIAGNOSIS — E785 Hyperlipidemia, unspecified: Secondary | ICD-10-CM | POA: Diagnosis present

## 2015-12-25 DIAGNOSIS — A084 Viral intestinal infection, unspecified: Secondary | ICD-10-CM | POA: Diagnosis not present

## 2015-12-25 DIAGNOSIS — M4806 Spinal stenosis, lumbar region: Secondary | ICD-10-CM | POA: Diagnosis present

## 2015-12-25 DIAGNOSIS — Z66 Do not resuscitate: Secondary | ICD-10-CM | POA: Diagnosis present

## 2015-12-25 DIAGNOSIS — Z8546 Personal history of malignant neoplasm of prostate: Secondary | ICD-10-CM

## 2015-12-25 DIAGNOSIS — N179 Acute kidney failure, unspecified: Secondary | ICD-10-CM

## 2015-12-25 LAB — URINALYSIS, ROUTINE W REFLEX MICROSCOPIC
Bilirubin Urine: NEGATIVE
Glucose, UA: NEGATIVE mg/dL
Hgb urine dipstick: NEGATIVE
Ketones, ur: NEGATIVE mg/dL
LEUKOCYTES UA: NEGATIVE
NITRITE: NEGATIVE
PH: 5 (ref 5.0–8.0)
Protein, ur: 30 mg/dL — AB
SPECIFIC GRAVITY, URINE: 1.024 (ref 1.005–1.030)

## 2015-12-25 LAB — GASTROINTESTINAL PANEL BY PCR, STOOL (REPLACES STOOL CULTURE)
ADENOVIRUS F40/41: NOT DETECTED
Astrovirus: NOT DETECTED
CRYPTOSPORIDIUM: NOT DETECTED
CYCLOSPORA CAYETANENSIS: NOT DETECTED
Campylobacter species: NOT DETECTED
E. coli O157: NOT DETECTED
ENTEROPATHOGENIC E COLI (EPEC): NOT DETECTED
Entamoeba histolytica: NOT DETECTED
Enteroaggregative E coli (EAEC): NOT DETECTED
Enterotoxigenic E coli (ETEC): NOT DETECTED
GIARDIA LAMBLIA: NOT DETECTED
Norovirus GI/GII: NOT DETECTED
Plesimonas shigelloides: NOT DETECTED
ROTAVIRUS A: DETECTED — AB
SALMONELLA SPECIES: NOT DETECTED
SHIGA LIKE TOXIN PRODUCING E COLI (STEC): NOT DETECTED
Sapovirus (I, II, IV, and V): NOT DETECTED
Shigella/Enteroinvasive E coli (EIEC): NOT DETECTED
VIBRIO SPECIES: NOT DETECTED
Vibrio cholerae: NOT DETECTED
YERSINIA ENTEROCOLITICA: NOT DETECTED

## 2015-12-25 LAB — URINE MICROSCOPIC-ADD ON
Bacteria, UA: NONE SEEN
RBC / HPF: NONE SEEN RBC/hpf (ref 0–5)
WBC UA: NONE SEEN WBC/hpf (ref 0–5)

## 2015-12-25 LAB — CBC
HCT: 47.6 % (ref 39.0–52.0)
Hemoglobin: 14.8 g/dL (ref 13.0–17.0)
MCH: 21.9 pg — ABNORMAL LOW (ref 26.0–34.0)
MCHC: 31.1 g/dL (ref 30.0–36.0)
MCV: 70.3 fL — ABNORMAL LOW (ref 78.0–100.0)
PLATELETS: 649 10*3/uL — AB (ref 150–400)
RBC: 6.77 MIL/uL — ABNORMAL HIGH (ref 4.22–5.81)
RDW: 18.9 % — AB (ref 11.5–15.5)
WBC: 12.4 10*3/uL — ABNORMAL HIGH (ref 4.0–10.5)

## 2015-12-25 LAB — COMPREHENSIVE METABOLIC PANEL
ALBUMIN: 3.7 g/dL (ref 3.5–5.0)
ALT: 31 U/L (ref 17–63)
ANION GAP: 9 (ref 5–15)
AST: 29 U/L (ref 15–41)
Alkaline Phosphatase: 75 U/L (ref 38–126)
BUN: 41 mg/dL — ABNORMAL HIGH (ref 6–20)
CHLORIDE: 109 mmol/L (ref 101–111)
CO2: 21 mmol/L — AB (ref 22–32)
CREATININE: 1.58 mg/dL — AB (ref 0.61–1.24)
Calcium: 8 mg/dL — ABNORMAL LOW (ref 8.9–10.3)
GFR calc non Af Amer: 37 mL/min — ABNORMAL LOW (ref >60)
GFR, EST AFRICAN AMERICAN: 42 mL/min — AB (ref >60)
Glucose, Bld: 106 mg/dL — ABNORMAL HIGH (ref 65–99)
Potassium: 4.1 mmol/L (ref 3.5–5.1)
SODIUM: 139 mmol/L (ref 135–145)
Total Bilirubin: 0.4 mg/dL (ref 0.3–1.2)
Total Protein: 6.6 g/dL (ref 6.5–8.1)

## 2015-12-25 LAB — MAGNESIUM: MAGNESIUM: 1.9 mg/dL (ref 1.7–2.4)

## 2015-12-25 LAB — C DIFFICILE QUICK SCREEN W PCR REFLEX
C DIFFICILE (CDIFF) INTERP: NEGATIVE
C DIFFICILE (CDIFF) TOXIN: NEGATIVE
C Diff antigen: NEGATIVE

## 2015-12-25 MED ORDER — SODIUM CHLORIDE 0.9 % IV BOLUS (SEPSIS)
500.0000 mL | Freq: Once | INTRAVENOUS | Status: AC
  Administered 2015-12-25: 500 mL via INTRAVENOUS

## 2015-12-25 MED ORDER — ASPIRIN 81 MG PO CHEW
81.0000 mg | CHEWABLE_TABLET | Freq: Every day | ORAL | Status: DC
  Administered 2015-12-25 – 2015-12-28 (×4): 81 mg via ORAL
  Filled 2015-12-25 (×3): qty 1

## 2015-12-25 MED ORDER — MEMANTINE HCL ER 7 MG PO CP24
21.0000 mg | ORAL_CAPSULE | Freq: Every day | ORAL | Status: AC
  Administered 2015-12-25: 21 mg via ORAL
  Filled 2015-12-25: qty 1

## 2015-12-25 MED ORDER — GALANTAMINE HYDROBROMIDE 4 MG PO TABS
12.0000 mg | ORAL_TABLET | Freq: Two times a day (BID) | ORAL | Status: DC
  Administered 2015-12-25 – 2015-12-28 (×6): 12 mg via ORAL
  Filled 2015-12-25 (×7): qty 3

## 2015-12-25 MED ORDER — ACETAMINOPHEN 650 MG RE SUPP: 650.0000 mg | Freq: Four times a day (QID) | RECTAL | Status: DC | PRN

## 2015-12-25 MED ORDER — SODIUM CHLORIDE 0.9 % IV BOLUS (SEPSIS)
1000.0000 mL | Freq: Once | INTRAVENOUS | Status: AC
  Administered 2015-12-25: 1000 mL via INTRAVENOUS

## 2015-12-25 MED ORDER — POTASSIUM CHLORIDE IN NACL 20-0.45 MEQ/L-% IV SOLN
INTRAVENOUS | Status: DC
  Administered 2015-12-25 – 2015-12-26 (×3): via INTRAVENOUS
  Filled 2015-12-25 (×4): qty 1000

## 2015-12-25 MED ORDER — ONDANSETRON HCL 4 MG/2ML IJ SOLN: 4.0000 mg | Freq: Four times a day (QID) | INTRAMUSCULAR | Status: DC | PRN

## 2015-12-25 MED ORDER — ENOXAPARIN SODIUM 30 MG/0.3ML ~~LOC~~ SOLN
30.0000 mg | SUBCUTANEOUS | Status: DC
  Administered 2015-12-25 – 2015-12-27 (×3): 30 mg via SUBCUTANEOUS
  Filled 2015-12-25 (×2): qty 0.3

## 2015-12-25 MED ORDER — ONDANSETRON HCL 4 MG/2ML IJ SOLN
4.0000 mg | Freq: Once | INTRAMUSCULAR | Status: AC
  Administered 2015-12-25: 4 mg via INTRAVENOUS
  Filled 2015-12-25: qty 2

## 2015-12-25 MED ORDER — ONDANSETRON HCL 4 MG PO TABS: 4.0000 mg | ORAL_TABLET | Freq: Four times a day (QID) | ORAL | Status: DC | PRN

## 2015-12-25 MED ORDER — AMLODIPINE BESYLATE 5 MG PO TABS
5.0000 mg | ORAL_TABLET | Freq: Every day | ORAL | Status: DC
  Administered 2015-12-25 – 2015-12-28 (×4): 5 mg via ORAL
  Filled 2015-12-25 (×4): qty 1

## 2015-12-25 MED ORDER — MEMANTINE HCL ER 28 MG PO CP24
28.0000 mg | ORAL_CAPSULE | Freq: Every day | ORAL | Status: DC
  Administered 2015-12-26 – 2015-12-28 (×3): 28 mg via ORAL
  Filled 2015-12-25 (×3): qty 1

## 2015-12-25 MED ORDER — ACETAMINOPHEN 325 MG PO TABS: 650.0000 mg | ORAL_TABLET | Freq: Four times a day (QID) | ORAL | Status: DC | PRN

## 2015-12-25 MED ORDER — LEVOTHYROXINE SODIUM 88 MCG PO TABS
88.0000 ug | ORAL_TABLET | Freq: Every day | ORAL | Status: DC
  Administered 2015-12-26 – 2015-12-28 (×3): 88 ug via ORAL
  Filled 2015-12-25 (×3): qty 1

## 2015-12-25 NOTE — H&P (Signed)
History and Physical  Patient Name: Billy Banks     [id number]    DOB: 1922-08-16    DOA: 12/25/2015 PCP: [facility]   Patient coming from: Home  Chief Complaint: Diarrhea and weakness  HPI: Billy Banks is a 82 y.o. male with a past medical history significant for dementia, hypothyroidism and HTN who presents with weakness and diarrhea.  The patient was in his usual state of health until about two days ago when he started to develop abdominal discomfort, nausea, NBNB emesis, and frequent diarrhea.  The diarrhea yesterday was >10 times, and daughter with whom he lives was concerned that he was not eating or drinking and appeared weak, so she brought him to the ER.  ED course: -Afebrile, hemodynamically stable, saturating well on ambient air -Na 139, K 4.1, Cr 1.6 (baseline 1.2), WBC 12.4K, Hgb 14 -Urinalysis clear -Flat radiograph of the chest and abdomen showed no evidence of SBO or pneumonia, but gas throughout the colon -He received IV fluids and sample was sent for Cdiff and TRH were called for admission.   He has no recent travel or sick contacts.  Lives with daughter and goes to day center for elderly with dementia, no known outbreaks there.  No previous history of renal failure.  No recent antibiotics or history of Cdiff or known exposure.  Last admission last Dec.     Review of Systems:  Pt complains of nausea, decreased appetite, gas, diarrhea. Pt denies any fever, chills, hematochezia, melena.  All other systems negative except as just noted or noted in the history of present illness.    Past Medical History  Diagnosis Date  . GERD (gastroesophageal reflux disease)   . Hyperlipidemia   . Alzheimer disease   . Macular degeneration   . Cancer [facility])     prostate  . Hypertension   . Thyroid disease   . History of small bowel obstruction 11/17/2013  . Adult hypothyroidism 06/24/2015  . Bright red blood per rectum 06/24/2015  . Essential  hypertension 05/02/2015  . Excessive falling 08/08/2014  . H/O malignant neoplasm of prostate 06/24/2015  . History of small bowel obstruction 11/17/2013  . HLD (hyperlipidemia) 06/24/2015  . Late onset Alzheimer's disease without behavioral disturbance 05/02/2015  . Left anterior fascicular block 06/24/2015  . Osteoporosis 06/24/2015  . Spinal stenosis of lumbar region   . Weakness 06/23/2015    Past Surgical History  Procedure Laterality Date  . Prostatectomy    . Eye surgery    . Knee replacement surgery Bilateral     Social History: Patient lives with his daughter.  The patient walks with a walker or mostly a wheelchair.  He is a former smoker.  From [place], [place], worked in [employer].  Was in [employer].  Marked dementia, remembers daughter, but not year and forgets wife (who is deceased).    No Known Allergies  Family history: family history is not known. He was adopted.  Prior to Admission medications   Medication Sig Start Date End Date Taking? Authorizing Provider  acetaminophen (TYLENOL) 325 MG tablet Take 650 mg by mouth every 8 (eight) hours as needed for mild pain, moderate pain or headache.   Yes Historical Provider, MD  amLODipine (NORVASC) 5 MG tablet Take 5 mg by mouth daily.   Yes Historical Provider, MD  aspirin 81 MG tablet Take 81 mg by mouth daily.   Yes Historical Provider, MD  Calcium Carb-Cholecalciferol (CALCIUM 600 + D PO) Take 1  tablet by mouth 2 (two) times daily.    Yes Historical Provider, MD  galantamine (RAZADYNE) 12 MG tablet Take 1 tablet (12 mg total) by mouth 2 (two) times daily. 08/24/15  Yes Adam Telford Nab, DO  levothyroxine (SYNTHROID, LEVOTHROID) 88 MCG tablet Take 88 mcg by mouth daily before breakfast.   Yes Historical Provider, MD  Memantine HCl ER (NAMENDA XR TITRATION PACK) 7 & 14 & 21 &28 MG CP24 Take as directed. 12/05/15  Yes Adam Telford Nab, DO  Multiple Vitamins-Minerals (PRESERVISION AREDS 2 PO) Take 1 tablet by mouth 2 (two) times daily.   Yes Historical  Provider, MD  Omega-3 Fatty Acids (FISH OIL PO) Take 1,000 mg by mouth 2 (two) times daily.    Yes Historical Provider, MD       Physical Exam: BP 151/77 mmHg  Pulse 74  Temp(Src) 98.1 F (36.7 C) (Oral)  Resp 18  SpO2 91% General appearance: Well-developed, elderly adult male, alert and in no acute distress.   Eyes: Anicteric, conjunctiva pink, lids and lashes normal.     ENT: No nasal deformity, discharge, or epistaxis.  OP tacky dry without lesions.   Lymph: No cervical or supraclavicular lymphadenopathy. Skin: Warm and dry.  No jaundice.  No suspicious rashes or lesions. Cardiac: Tachycardic, regular, nl S1-S2, no murmurs appreciated.  Capillary refill is brisk.  JVP flat.  Minimal LE edema.  Radial pulses 2+ and symmetric. Respiratory: Normal respiratory rate and rhythm.  CTAB without rales or wheezes. Abdomen: Abdomen soft without rigidity.  Distended, without focal TTP or guarding. No ascites, distension.   MSK: No deformities or effusions. Neuro: Dementia moderate.  Sensorium intact and responding to questions, attention normal.  Speech is fluent.  Moves all extremities equally and with normal coordination.    Appears globally weak, unable to stand. Psych: Behavior appropriate.  Affect pleasant.  No evidence of aural or visual hallucinations or delusions.       Labs on Admission:  I have personally reviewed following labs and imaging studies: CBC:  Recent Labs Lab 12/25/15 0939  WBC 12.4*  HGB 14.8  HCT 47.6  MCV 70.3*  PLT [id number]*   Basic Metabolic Panel:  Recent Labs Lab 12/25/15 0939  NA 139  K 4.1  CL 109  CO2 21*  GLUCOSE 106*  BUN 41*  CREATININE 1.58*  CALCIUM 8.0*   GFR: CrCl cannot be calculated (Unknown ideal weight.). Liver Function Tests:  Recent Labs Lab 12/25/15 0939  AST 29  ALT 31  ALKPHOS 75  BILITOT 0.4  PROT 6.6  ALBUMIN 3.7   No results for input(s): LIPASE, AMYLASE in the last 168 hours. No results for input(s): AMMONIA in  the last 168 hours. Coagulation Profile: No results for input(s): INR, PROTIME in the last 168 hours. Cardiac Enzymes: No results for input(s): CKTOTAL, CKMB, CKMBINDEX, TROPONINI in the last 168 hours. BNP (last 3 results) No results for input(s): PROBNP in the last 8760 hours. HbA1C: No results for input(s): HGBA1C in the last 72 hours. CBG: No results for input(s): GLUCAP in the last 168 hours. Lipid Profile: No results for input(s): CHOL, HDL, LDLCALC, TRIG, CHOLHDL, LDLDIRECT in the last 72 hours. Thyroid Function Tests: No results for input(s): TSH, T4TOTAL, FREET4, T3FREE, THYROIDAB in the last 72 hours. Anemia Panel: No results for input(s): VITAMINB12, FOLATE, FERRITIN, TIBC, IRON, RETICCTPCT in the last 72 hours. Sepsis Labs: @LABRCNTIP (procalcitonin:4,lacticidven:4) )No results found for this or any previous visit (from the past 240 hour(s)).  Radiological Exams on Admission: Personally reviewed: Dg Abd Acute W/chest  12/25/2015  CLINICAL DATA:  Diarrhea for 2 days. EXAM: DG ABDOMEN ACUTE W/ 1V CHEST COMPARISON:  06/24/2015 FINDINGS: Mild cardiac enlargement. No pleural effusion or edema. Aortic atherosclerosis noted. There is a calcified granuloma in the right base. There is a mildly dilated loop a small bowel within the left abdomen measuring up to 1.8 cm. Gas noted throughout the colon up to the rectum. IMPRESSION: 1. Nonobstructive bowel gas pattern. 2. No active cardiopulmonary abnormalities. Electronically Signed   By: Kerby Moors M.D.   On: 12/25/2015 11:26        Assessment/Plan 1. AKI sv pre-renal azotemia:  Baseline Cr 1.2, now 1.6 in setting of dehydration.  UA without hematuria or pyuria. -Check urine electrolytes -Fluids overnight and recheck BMP tomorrow -MIVF for now and ondansetron for nausea   2. Leukocytosis:  Likely reactive -Repeat CBC tomorrow and CT abdomen and pelvis with contrast if CBC rising or if new fever  3. HTN:  -Continue  amlodipine and baby aspirin  4. Hypothyroidism:  -Continue levothyroxine  5. Diarrhea:  -Check Cdiff panel and GI pathogen panel  6. Dementia:  -Continue Namenda and galantamine      DVT prophylaxis: Enoxaparin  Code Status: DO NOT RESUSCITATE  Family Communication: Daughter/POA at bedside  Disposition Plan: Anticipate fluids overnight and IV ondansetron.  Repeat CBC and BMP tomorrow.  If normalized, likely safe to discharge tomorrow. Consults called: None Admission status: Observation, med surg   Medical decision making: Patient seen at 1:44 PM on 12/25/2015.  The patient was discussed with Dr. Ashok Cordia. What exists of the patient's chart was reviewed in depth.  Clinical condition: stable.        Edwin Dada [facility] Pager [phone]

## 2015-12-25 NOTE — ED Notes (Signed)
MD at bedside. 

## 2015-12-25 NOTE — ED Notes (Signed)
Hospitalist at bedside 

## 2015-12-25 NOTE — ED Notes (Signed)
Pt tried to provide stool sample, was unsuccessful

## 2015-12-25 NOTE — ED Provider Notes (Signed)
CSN: [id number]     Arrival date & time 12/25/15  0841 History   First MD Initiated Contact with Patient 12/25/15 0914     Chief Complaint  Patient presents with  . Diarrhea     (Consider location/radiation/quality/duration/timing/severity/associated sxs/prior Treatment) Patient is a 82 y.o. male presenting with diarrhea. The history is provided by the patient.  Diarrhea Associated symptoms: vomiting   Associated symptoms: no abdominal pain, no fever and no headaches   Patient c/o multiple diarrhea stools in the past 2 days, ?20, v loose/watery, not bloody. Decreased appetite in past 2 days with very little po intake. 2 episodes emesis, not bloody or bilious. No abd distension, denies abd pain. No known ill contacts. No recent antibiotic use. Compliant w normal meds. No fever or chills. Is making normal amount urine, no gu c/o.      Past Medical History  Diagnosis Date  . GERD (gastroesophageal reflux disease)   . Hyperlipidemia   . Alzheimer disease   . Macular degeneration   . Cancer [facility])     prostate  . Hypertension   . Thyroid disease   . History of small bowel obstruction 11/17/2013  . Adult hypothyroidism 06/24/2015  . Bright red blood per rectum 06/24/2015  . Essential hypertension 05/02/2015  . Excessive falling 08/08/2014  . H/O malignant neoplasm of prostate 06/24/2015  . History of small bowel obstruction 11/17/2013  . HLD (hyperlipidemia) 06/24/2015  . Late onset Alzheimer's disease without behavioral disturbance 05/02/2015  . Left anterior fascicular block 06/24/2015  . Osteoporosis 06/24/2015  . Spinal stenosis of lumbar region   . Weakness 06/23/2015   Past Surgical History  Procedure Laterality Date  . Prostatectomy    . Eye surgery    . Knee replacement surgery Bilateral    Family History  Problem Relation Age of Onset  . Adopted: Yes   Social History  Substance Use Topics  . Smoking status: Former Research scientist (life sciences)  . Smokeless tobacco: Never Used  . Alcohol Use:  No    Review of Systems  Constitutional: Negative for fever.  HENT: Negative for sore throat.   Eyes: Negative for redness.  Respiratory: Negative for cough and shortness of breath.   Cardiovascular: Negative for chest pain.  Gastrointestinal: Positive for nausea, vomiting and diarrhea. Negative for abdominal pain and abdominal distention.  Genitourinary: Negative for dysuria and flank pain.  Musculoskeletal: Negative for back pain and neck pain.  Skin: Negative for rash.  Neurological: Negative for headaches.  Hematological: Does not bruise/bleed easily.  Psychiatric/Behavioral: Negative for confusion.      Allergies  Review of patient's allergies indicates no known allergies.  Home Medications   Prior to Admission medications   Medication Sig Start Date End Date Taking? Authorizing Provider  acetaminophen (TYLENOL) 325 MG tablet Take 650 mg by mouth every 8 (eight) hours as needed for mild pain, moderate pain or headache.    Historical Provider, MD  amLODipine (NORVASC) 5 MG tablet Take 5 mg by mouth daily.    Historical Provider, MD  aspirin 81 MG tablet Take 81 mg by mouth daily.    Historical Provider, MD  Calcium Carb-Cholecalciferol (CALCIUM 600 + D PO) Take 1 tablet by mouth 2 (two) times daily.     Historical Provider, MD  galantamine (RAZADYNE) 12 MG tablet Take 1 tablet (12 mg total) by mouth 2 (two) times daily. 08/24/15   Pieter Partridge, DO  levothyroxine (SYNTHROID, LEVOTHROID) 88 MCG tablet Take 88 mcg by mouth daily before  breakfast.    Historical Provider, MD  Memantine HCl ER (NAMENDA XR TITRATION PACK) 7 & 14 & 21 &28 MG CP24 Take as directed. 12/05/15   Pieter Partridge, DO  Multiple Vitamins-Minerals (PRESERVISION AREDS 2 PO) Take 1 tablet by mouth 2 (two) times daily.    Historical Provider, MD  Omega-3 Fatty Acids (FISH OIL PO) Take 1,000 mg by mouth 2 (two) times daily.     Historical Provider, MD   BP 172/84 mmHg  Pulse 84  Temp(Src) 98.1 F (36.7 C) (Oral)   Resp 16  SpO2 95% Physical Exam  Constitutional: He appears well-developed and well-nourished. No distress.  HENT:  Mouth/Throat: Oropharynx is clear and moist.  Eyes: Conjunctivae are normal. No scleral icterus.  Neck: Neck supple. No tracheal deviation present.  Cardiovascular: Normal rate, regular rhythm, normal heart sounds and intact distal pulses.  Exam reveals no gallop and no friction rub.   No murmur heard. Pulmonary/Chest: Effort normal and breath sounds normal. No accessory muscle usage. No respiratory distress.  Abdominal: Soft. Bowel sounds are normal. He exhibits no distension and no mass. There is no tenderness. There is no rebound and no guarding.  Genitourinary:  No cva tenderness  Musculoskeletal: Normal range of motion. He exhibits no edema or tenderness.  Neurological: He is alert.  Skin: Skin is warm and dry. He is not diaphoretic.  Psychiatric: He has a normal mood and affect.  Nursing note and vitals reviewed.   ED Course  Procedures (including critical care time) Labs Review   Results for orders placed or performed during the hospital encounter of 12/25/15  CBC  Result Value Ref Range   WBC 12.4 (H) 4.0 - 10.5 K/uL   RBC 6.77 (H) 4.22 - 5.81 MIL/uL   Hemoglobin 14.8 13.0 - 17.0 g/dL   HCT 47.6 39.0 - 52.0 %   MCV 70.3 (L) 78.0 - 100.0 fL   MCH 21.9 (L) 26.0 - 34.0 pg   MCHC 31.1 30.0 - 36.0 g/dL   RDW 18.9 (H) 11.5 - 15.5 %   Platelets 649 (H) 150 - 400 K/uL  Comprehensive metabolic panel  Result Value Ref Range   Sodium 139 135 - 145 mmol/L   Potassium 4.1 3.5 - 5.1 mmol/L   Chloride 109 101 - 111 mmol/L   CO2 21 (L) 22 - 32 mmol/L   Glucose, Bld 106 (H) 65 - 99 mg/dL   BUN 41 (H) 6 - 20 mg/dL   Creatinine, Ser 1.58 (H) 0.61 - 1.24 mg/dL   Calcium 8.0 (L) 8.9 - 10.3 mg/dL   Total Protein 6.6 6.5 - 8.1 g/dL   Albumin 3.7 3.5 - 5.0 g/dL   AST 29 15 - 41 U/L   ALT 31 17 - 63 U/L   Alkaline Phosphatase 75 38 - 126 U/L   Total Bilirubin 0.4 0.3  - 1.2 mg/dL   GFR calc non Af Amer 37 (L) >60 mL/min   GFR calc Af Amer 42 (L) >60 mL/min   Anion gap 9 5 - 15  Urinalysis, Routine w reflex microscopic (not at [facility])  Result Value Ref Range   Color, Urine YELLOW YELLOW   APPearance CLOUDY (A) CLEAR   Specific Gravity, Urine 1.024 1.005 - 1.030   pH 5.0 5.0 - 8.0   Glucose, UA NEGATIVE NEGATIVE mg/dL   Hgb urine dipstick NEGATIVE NEGATIVE   Bilirubin Urine NEGATIVE NEGATIVE   Ketones, ur NEGATIVE NEGATIVE mg/dL   Protein, ur 30 (A) NEGATIVE mg/dL  Nitrite NEGATIVE NEGATIVE   Leukocytes, UA NEGATIVE NEGATIVE  Urine microscopic-add on  Result Value Ref Range   Squamous Epithelial / LPF 0-5 (A) NONE SEEN   WBC, UA NONE SEEN 0 - 5 WBC/hpf   RBC / HPF NONE SEEN 0 - 5 RBC/hpf   Bacteria, UA NONE SEEN NONE SEEN   Dg Abd Acute W/chest  12/25/2015  CLINICAL DATA:  Diarrhea for 2 days. EXAM: DG ABDOMEN ACUTE W/ 1V CHEST COMPARISON:  06/24/2015 FINDINGS: Mild cardiac enlargement. No pleural effusion or edema. Aortic atherosclerosis noted. There is a calcified granuloma in the right base. There is a mildly dilated loop a small bowel within the left abdomen measuring up to 1.8 cm. Gas noted throughout the colon up to the rectum. IMPRESSION: 1. Nonobstructive bowel gas pattern. 2. No active cardiopulmonary abnormalities. Electronically Signed   By: Kerby Moors M.D.   On: 12/25/2015 11:26      I have personally reviewed and evaluated these lab results as part of my medical decision-making.    MDM    Iv ns bolus. Labs.  Reviewed nursing notes and prior charts for additional history.   Additional ns bolus.  Patient w no appetite, unable to take po.  As compared to prior labs, pt appears w dehydration and mild AKI.  Patient did have additional diarrhea - will check for c diff.  Med service consulted for admission.     Lajean Saver, MD 12/25/15 [phone]

## 2015-12-25 NOTE — ED Notes (Signed)
Patient recently started on Nemenda per daughter.  States she has held his stool softener since he has been having diarrhea.

## 2015-12-25 NOTE — ED Notes (Signed)
Per pt daughter, pt has had diarrhea x2 days. Pt has had 2 episodes of emesis. Denies fever. Hx dementia.

## 2015-12-26 DIAGNOSIS — R748 Abnormal levels of other serum enzymes: Secondary | ICD-10-CM | POA: Diagnosis not present

## 2015-12-26 DIAGNOSIS — N179 Acute kidney failure, unspecified: Secondary | ICD-10-CM

## 2015-12-26 DIAGNOSIS — E860 Dehydration: Secondary | ICD-10-CM | POA: Diagnosis not present

## 2015-12-26 DIAGNOSIS — R197 Diarrhea, unspecified: Secondary | ICD-10-CM | POA: Diagnosis not present

## 2015-12-26 DIAGNOSIS — I10 Essential (primary) hypertension: Secondary | ICD-10-CM | POA: Diagnosis not present

## 2015-12-26 LAB — BASIC METABOLIC PANEL
Anion gap: 6 (ref 5–15)
BUN: 32 mg/dL — AB (ref 6–20)
CALCIUM: 7.1 mg/dL — AB (ref 8.9–10.3)
CO2: 19 mmol/L — ABNORMAL LOW (ref 22–32)
CREATININE: 1.35 mg/dL — AB (ref 0.61–1.24)
Chloride: 113 mmol/L — ABNORMAL HIGH (ref 101–111)
GFR calc Af Amer: 51 mL/min — ABNORMAL LOW (ref >60)
GFR, EST NON AFRICAN AMERICAN: 44 mL/min — AB (ref >60)
GLUCOSE: 90 mg/dL (ref 65–99)
Potassium: 4.4 mmol/L (ref 3.5–5.1)
SODIUM: 138 mmol/L (ref 135–145)

## 2015-12-26 LAB — CBC
HEMATOCRIT: 45.4 % (ref 39.0–52.0)
Hemoglobin: 13.5 g/dL (ref 13.0–17.0)
MCH: 21.1 pg — ABNORMAL LOW (ref 26.0–34.0)
MCHC: 29.7 g/dL — AB (ref 30.0–36.0)
MCV: 71 fL — ABNORMAL LOW (ref 78.0–100.0)
PLATELETS: 624 10*3/uL — AB (ref 150–400)
RBC: 6.39 MIL/uL — ABNORMAL HIGH (ref 4.22–5.81)
RDW: 19 % — AB (ref 11.5–15.5)
WBC: 12.4 10*3/uL — AB (ref 4.0–10.5)

## 2015-12-26 MED ORDER — POTASSIUM CHLORIDE IN NACL 20-0.45 MEQ/L-% IV SOLN
INTRAVENOUS | Status: AC
  Administered 2015-12-26: 1000 mL via INTRAVENOUS
  Filled 2015-12-26 (×3): qty 1000

## 2015-12-26 NOTE — Progress Notes (Signed)
[facility] PROGRESS NOTE  Billy Banks [id number] DOB: 12/18/1922 DOA: 12/25/2015 PCP: [facility]  Assessment/Plan: 82 y/o male with PMH of HTN, Hypothyroidism, Prostate CA, Dementia presented with diarrhea, generalized weakness for few days. Found to have rotavirus   Acute viral diarrhea. +rotavirus. Pt still having 5-7 episodes of diarrhea. Afebrile. We will cont IVF due to poor oral intake+diarrhea, antiemetics, supportive care   AKI. Prerenal due to diarrhea. Renal function is improving. Cont IVF, monitor urine output.  Microcytosis. Probable Iron def. Reactive thrombocytosis. Check iron profile     Code Status: DNR Family Communication: d/w patient, his daughter  (indicate person spoken with, relationship, and if by phone, the number) Disposition Plan: home 24-48 hrs    Consultants:  none  Procedures:  none  Antibiotics:  none (indicate start date, and stop date if known)  HPI/Subjective: Alert, no distress   Objective: Filed Vitals:   12/25/15 2109 12/26/15 0445  BP: 118/71 130/74  Pulse: 76 74  Temp: 99 F (37.2 C) 98.1 F (36.7 C)  Resp: 18 16    Intake/Output Summary (Last 24 hours) at 12/26/15 1139 Last data filed at 12/26/15 0456  Gross per 24 hour  Intake      0 ml  Output    300 ml  Net   -300 ml   Filed Weights   12/26/15 0639  Weight: 82 kg (180 lb 12.4 oz)    Exam:   General:  Comfortable   Cardiovascular: s1,s2 rrr  Respiratory: CAT BL   Abdomen: soft, nt, nd   Musculoskeletal: no leg edema    Data Reviewed: Basic Metabolic Panel:  Recent Labs Lab 12/25/15 0939 12/25/15 1503 12/26/15 0357  NA 139  --  138  K 4.1  --  4.4  CL 109  --  113*  CO2 21*  --  19*  GLUCOSE 106*  --  90  BUN 41*  --  32*  CREATININE 1.58*  --  1.35*  CALCIUM 8.0*  --  7.1*  MG  --  1.9  --    Liver Function Tests:  Recent Labs Lab 12/25/15 0939  AST 29  ALT 31  ALKPHOS 75  BILITOT 0.4  PROT 6.6   ALBUMIN 3.7   No results for input(s): LIPASE, AMYLASE in the last 168 hours. No results for input(s): AMMONIA in the last 168 hours. CBC:  Recent Labs Lab 12/25/15 0939 12/26/15 0357  WBC 12.4* 12.4*  HGB 14.8 13.5  HCT 47.6 45.4  MCV 70.3* 71.0*  PLT 649* 624*   Cardiac Enzymes: No results for input(s): CKTOTAL, CKMB, CKMBINDEX, TROPONINI in the last 168 hours. BNP (last 3 results) No results for input(s): BNP in the last 8760 hours.  ProBNP (last 3 results) No results for input(s): PROBNP in the last 8760 hours.  CBG: No results for input(s): GLUCAP in the last 168 hours.  Recent Results (from the past 240 hour(s))  C difficile quick scan w PCR reflex     Status: None   Collection Time: 12/25/15  1:34 PM  Result Value Ref Range Status   C Diff antigen NEGATIVE NEGATIVE Final   C Diff toxin NEGATIVE NEGATIVE Final   C Diff interpretation Negative for toxigenic C. difficile  Final  Gastrointestinal Panel by PCR , Stool     Status: Abnormal   Collection Time: 12/25/15  1:34 PM  Result Value Ref Range Status   Campylobacter species NOT DETECTED NOT DETECTED Final   Plesimonas  shigelloides NOT DETECTED NOT DETECTED Final   Salmonella species NOT DETECTED NOT DETECTED Final   Yersinia enterocolitica NOT DETECTED NOT DETECTED Final   Vibrio species NOT DETECTED NOT DETECTED Final   Vibrio cholerae NOT DETECTED NOT DETECTED Final   Enteroaggregative E coli (EAEC) NOT DETECTED NOT DETECTED Final   Enteropathogenic E coli (EPEC) NOT DETECTED NOT DETECTED Final   Enterotoxigenic E coli (ETEC) NOT DETECTED NOT DETECTED Final   Shiga like toxin producing E coli (STEC) NOT DETECTED NOT DETECTED Final   E. coli O157 NOT DETECTED NOT DETECTED Final   Shigella/Enteroinvasive E coli (EIEC) NOT DETECTED NOT DETECTED Final   Cryptosporidium NOT DETECTED NOT DETECTED Final   Cyclospora cayetanensis NOT DETECTED NOT DETECTED Final   Entamoeba histolytica NOT DETECTED NOT DETECTED  Final   Giardia lamblia NOT DETECTED NOT DETECTED Final   Adenovirus F40/41 NOT DETECTED NOT DETECTED Final   Astrovirus NOT DETECTED NOT DETECTED Final   Norovirus GI/GII NOT DETECTED NOT DETECTED Final   Rotavirus A DETECTED (A) NOT DETECTED Final   Sapovirus (I, II, IV, and V) NOT DETECTED NOT DETECTED Final     Studies: Dg Abd Acute W/chest  12/25/2015  CLINICAL DATA:  Diarrhea for 2 days. EXAM: DG ABDOMEN ACUTE W/ 1V CHEST COMPARISON:  06/24/2015 FINDINGS: Mild cardiac enlargement. No pleural effusion or edema. Aortic atherosclerosis noted. There is a calcified granuloma in the right base. There is a mildly dilated loop a small bowel within the left abdomen measuring up to 1.8 cm. Gas noted throughout the colon up to the rectum. IMPRESSION: 1. Nonobstructive bowel gas pattern. 2. No active cardiopulmonary abnormalities. Electronically Signed   By: Kerby Moors M.D.   On: 12/25/2015 11:26    Scheduled Meds: . amLODipine  5 mg Oral Daily  . aspirin  81 mg Oral Daily  . enoxaparin (LOVENOX) injection  30 mg Subcutaneous Q24H  . galantamine  12 mg Oral BID  . levothyroxine  88 mcg Oral QAC breakfast  . memantine  28 mg Oral Daily   Continuous Infusions: . 0.45 % NaCl with KCl 20 mEq / L 125 mL/hr at 12/26/15 0940    Principal Problem:   Elevated serum creatinine Active Problems:   Late onset Alzheimer's disease without behavioral disturbance   Essential hypertension   Adult hypothyroidism   Dehydration   Diarrhea   Leukocytosis    Time spent: >35 minutes     Kinnie Feil  [facility] Pager [phone]. If 7PM-7AM, please contact night-coverage at [email or web address], password [facility] 12/26/2015, 11:39 AM

## 2015-12-26 NOTE — Care Management Obs Status (Signed)
[facility] NOTIFICATION   Patient Details  Name: Billy Banks MRN: [id number] Date of Birth: 12-19-1922   Medicare Observation Status Notification Given:  Yes    Lynnell Catalan, RN 12/26/2015, 2:56 PM

## 2015-12-27 DIAGNOSIS — I10 Essential (primary) hypertension: Secondary | ICD-10-CM | POA: Diagnosis present

## 2015-12-27 DIAGNOSIS — R7989 Other specified abnormal findings of blood chemistry: Secondary | ICD-10-CM | POA: Diagnosis present

## 2015-12-27 DIAGNOSIS — Z96653 Presence of artificial knee joint, bilateral: Secondary | ICD-10-CM | POA: Diagnosis present

## 2015-12-27 DIAGNOSIS — E860 Dehydration: Secondary | ICD-10-CM | POA: Diagnosis present

## 2015-12-27 DIAGNOSIS — M810 Age-related osteoporosis without current pathological fracture: Secondary | ICD-10-CM | POA: Diagnosis present

## 2015-12-27 DIAGNOSIS — Z7982 Long term (current) use of aspirin: Secondary | ICD-10-CM | POA: Diagnosis not present

## 2015-12-27 DIAGNOSIS — F0280 Dementia in other diseases classified elsewhere without behavioral disturbance: Secondary | ICD-10-CM | POA: Diagnosis present

## 2015-12-27 DIAGNOSIS — E039 Hypothyroidism, unspecified: Secondary | ICD-10-CM | POA: Diagnosis present

## 2015-12-27 DIAGNOSIS — K219 Gastro-esophageal reflux disease without esophagitis: Secondary | ICD-10-CM | POA: Diagnosis present

## 2015-12-27 DIAGNOSIS — Z8546 Personal history of malignant neoplasm of prostate: Secondary | ICD-10-CM | POA: Diagnosis not present

## 2015-12-27 DIAGNOSIS — E611 Iron deficiency: Secondary | ICD-10-CM | POA: Diagnosis present

## 2015-12-27 DIAGNOSIS — Z79899 Other long term (current) drug therapy: Secondary | ICD-10-CM | POA: Diagnosis not present

## 2015-12-27 DIAGNOSIS — M4806 Spinal stenosis, lumbar region: Secondary | ICD-10-CM | POA: Diagnosis present

## 2015-12-27 DIAGNOSIS — B348 Other viral infections of unspecified site: Secondary | ICD-10-CM | POA: Diagnosis present

## 2015-12-27 DIAGNOSIS — R748 Abnormal levels of other serum enzymes: Secondary | ICD-10-CM | POA: Diagnosis not present

## 2015-12-27 DIAGNOSIS — E869 Volume depletion, unspecified: Secondary | ICD-10-CM | POA: Diagnosis not present

## 2015-12-27 DIAGNOSIS — A084 Viral intestinal infection, unspecified: Secondary | ICD-10-CM | POA: Diagnosis present

## 2015-12-27 DIAGNOSIS — E785 Hyperlipidemia, unspecified: Secondary | ICD-10-CM | POA: Diagnosis present

## 2015-12-27 DIAGNOSIS — H3530 Unspecified macular degeneration: Secondary | ICD-10-CM | POA: Diagnosis present

## 2015-12-27 DIAGNOSIS — Z66 Do not resuscitate: Secondary | ICD-10-CM | POA: Diagnosis present

## 2015-12-27 DIAGNOSIS — R197 Diarrhea, unspecified: Secondary | ICD-10-CM | POA: Diagnosis present

## 2015-12-27 DIAGNOSIS — Z87891 Personal history of nicotine dependence: Secondary | ICD-10-CM | POA: Diagnosis not present

## 2015-12-27 DIAGNOSIS — N179 Acute kidney failure, unspecified: Secondary | ICD-10-CM | POA: Diagnosis present

## 2015-12-27 DIAGNOSIS — G301 Alzheimer's disease with late onset: Secondary | ICD-10-CM | POA: Diagnosis present

## 2015-12-27 LAB — IRON AND TIBC
Iron: 16 ug/dL — ABNORMAL LOW (ref 45–182)
Saturation Ratios: 5 % — ABNORMAL LOW (ref 17.9–39.5)
TIBC: 346 ug/dL (ref 250–450)
UIBC: 330 ug/dL

## 2015-12-27 LAB — FERRITIN: Ferritin: 14 ng/mL — ABNORMAL LOW (ref 24–336)

## 2015-12-27 MED ORDER — SODIUM CHLORIDE 0.9 % IV SOLN
INTRAVENOUS | Status: DC
  Administered 2015-12-27: 16:00:00 via INTRAVENOUS

## 2015-12-27 NOTE — Progress Notes (Signed)
[facility] PROGRESS NOTE  Billy Banks [id number] DOB: [birth year] DOA: 12/25/2015 [facility]CP: [facility]  Assessment/Plan: 82 y/o male with PMH of HTN, Hypothyroidism, Prostate CA, Dementia presented with diarrhea, generalized weakness for few days. Found to have rotavirus   Acute viral diarrhea. +rotavirus. Pt still having diarrhea, and poor appetite. Discussed with the patient's daughter and Nurse. Continue supportive care. PT/OT consult. Likely DC in am. Disposition will depend on PT/OT result and clinical course.    AKI. Prerenal due to diarrhea. Slowly improving. Repeat BMP in am.   Microcytosis. Probable Iron def. Reactive thrombocytosis. Check iron profile     Code Status: DNR Family Communication: d/w patient, his daughter  (indicate person spoken with, relationship, and if by phone, the number) Disposition Plan: home 24-48 hrs    Consultants:  none  Procedures:  none  Antibiotics:  none (indicate start date, and stop date if known)  HPI/Subjective:  Still having diarrhea. Poor appetite  Objective: Filed Vitals:   12/27/15 0632 12/27/15 1421  BP: 156/89 137/76  Pulse: 80 80  Temp: 98.3 F (36.8 C) 98.6 F (37 C)  Resp: 18 18    Intake/Output Summary (Last 24 hours) at 12/27/15 1554 Last data filed at 12/27/15 1400  Gross per 24 hour  Intake   1080 ml  Output      0 ml  Net   1080 ml   Filed Weights   12/26/15 0639  Weight: 82 kg (180 lb 12.4 oz)    Exam:   General:  Comfortable   Cardiovascular: s1,s2 rrr  Respiratory: CAT BL   Abdomen: soft, nt, nd   Musculoskeletal: no leg edema    Neuro - Moves all limbs. Awake and alert  Data Reviewed: Basic Metabolic Panel:  Recent Labs Lab 12/25/15 0939 12/25/15 1503 12/26/15 0357  NA 139  --  138  K 4.1  --  4.4  CL 109  --  113*  CO2 21*  --  19*  GLUCOSE 106*  --  90  BUN 41*  --  32*  CREATININE 1.58*  --  1.35*  CALCIUM 8.0*  --  7.1*  MG  --  1.9  --     Liver Function Tests:  Recent Labs Lab 12/25/15 0939  AST 29  ALT 31  ALKPHOS 75  BILITOT 0.4  PROT 6.6  ALBUMIN 3.7   No results for input(s): LIPASE, AMYLASE in the last 168 hours. No results for input(s): AMMONIA in the last 168 hours. CBC:  Recent Labs Lab 12/25/15 0939 12/26/15 0357  WBC 12.4* 12.4*  HGB 14.8 13.5  HCT 47.6 45.4  MCV 70.3* 71.0*  PLT 649* 624*   Cardiac Enzymes: No results for input(s): CKTOTAL, CKMB, CKMBINDEX, TROPONINI in the last 168 hours. BNP (last 3 results) No results for input(s): BNP in the last 8760 hours.  ProBNP (last 3 results) No results for input(s): PROBNP in the last 8760 hours.  CBG: No results for input(s): GLUCAP in the last 168 hours.  Recent Results (from the past 240 hour(s))  C difficile quick scan w PCR reflex     Status: None   Collection Time: 12/25/15  1:34 PM  Result Value Ref Range Status   C Diff antigen NEGATIVE NEGATIVE Final   C Diff toxin NEGATIVE NEGATIVE Final   C Diff interpretation Negative for toxigenic C. difficile  Final  Gastrointestinal Panel by PCR , Stool     Status: Abnormal   Collection Time: 12/25/15  1:34 PM  Result Value Ref Range Status   Campylobacter species NOT DETECTED NOT DETECTED Final   Plesimonas shigelloides NOT DETECTED NOT DETECTED Final   Salmonella species NOT DETECTED NOT DETECTED Final   Yersinia enterocolitica NOT DETECTED NOT DETECTED Final   Vibrio species NOT DETECTED NOT DETECTED Final   Vibrio cholerae NOT DETECTED NOT DETECTED Final   Enteroaggregative E coli (EAEC) NOT DETECTED NOT DETECTED Final   Enteropathogenic E coli (EPEC) NOT DETECTED NOT DETECTED Final   Enterotoxigenic E coli (ETEC) NOT DETECTED NOT DETECTED Final   Shiga like toxin producing E coli (STEC) NOT DETECTED NOT DETECTED Final   E. coli O157 NOT DETECTED NOT DETECTED Final   Shigella/Enteroinvasive E coli (EIEC) NOT DETECTED NOT DETECTED Final   Cryptosporidium NOT DETECTED NOT DETECTED  Final   Cyclospora cayetanensis NOT DETECTED NOT DETECTED Final   Entamoeba histolytica NOT DETECTED NOT DETECTED Final   Giardia lamblia NOT DETECTED NOT DETECTED Final   Adenovirus F40/41 NOT DETECTED NOT DETECTED Final   Astrovirus NOT DETECTED NOT DETECTED Final   Norovirus GI/GII NOT DETECTED NOT DETECTED Final   Rotavirus A DETECTED (A) NOT DETECTED Final   Sapovirus (I, II, IV, and V) NOT DETECTED NOT DETECTED Final     Studies: No results found.  Scheduled Meds: . amLODipine  5 mg Oral Daily  . aspirin  81 mg Oral Daily  . enoxaparin (LOVENOX) injection  30 mg Subcutaneous Q24H  . galantamine  12 mg Oral BID  . levothyroxine  88 mcg Oral QAC breakfast  . memantine  28 mg Oral Daily   Continuous Infusions: . sodium chloride      Principal Problem:   Elevated serum creatinine Active Problems:   Late onset Alzheimer's disease without behavioral disturbance   Essential hypertension   Adult hypothyroidism   Dehydration   Diarrhea   Leukocytosis    Time spent: >35 minutes     [facility] Pager [phone]. If 7PM-7AM, please contact night-coverage at [email or web address], password [facility] 12/27/2015, 3:54 PM

## 2015-12-28 ENCOUNTER — Inpatient Hospital Stay (HOSPITAL_COMMUNITY): Payer: Medicare Other

## 2015-12-28 LAB — URINALYSIS, ROUTINE W REFLEX MICROSCOPIC
Bilirubin Urine: NEGATIVE
Glucose, UA: NEGATIVE mg/dL
Hgb urine dipstick: NEGATIVE
Ketones, ur: NEGATIVE mg/dL
Leukocytes, UA: NEGATIVE
Nitrite: NEGATIVE
Protein, ur: 30 mg/dL — AB
Specific Gravity, Urine: 1.017 (ref 1.005–1.030)
pH: 5 (ref 5.0–8.0)

## 2015-12-28 LAB — CBC WITH DIFFERENTIAL/PLATELET
Basophils Absolute: 0 10*3/uL (ref 0.0–0.1)
Basophils Relative: 0 %
Eosinophils Absolute: 0.2 10*3/uL (ref 0.0–0.7)
Eosinophils Relative: 1 %
HCT: 46.1 % (ref 39.0–52.0)
Hemoglobin: 14 g/dL (ref 13.0–17.0)
Lymphocytes Relative: 16 %
Lymphs Abs: 2.5 10*3/uL (ref 0.7–4.0)
MCH: 21.1 pg — ABNORMAL LOW (ref 26.0–34.0)
MCHC: 30.4 g/dL (ref 30.0–36.0)
MCV: 69.3 fL — ABNORMAL LOW (ref 78.0–100.0)
Monocytes Absolute: 1.7 10*3/uL — ABNORMAL HIGH (ref 0.1–1.0)
Monocytes Relative: 11 %
Neutro Abs: 11.5 10*3/uL — ABNORMAL HIGH (ref 1.7–7.7)
Neutrophils Relative %: 72 %
Platelets: 631 10*3/uL — ABNORMAL HIGH (ref 150–400)
RBC: 6.65 MIL/uL — ABNORMAL HIGH (ref 4.22–5.81)
RDW: 19 % — ABNORMAL HIGH (ref 11.5–15.5)
WBC: 15.9 10*3/uL — ABNORMAL HIGH (ref 4.0–10.5)

## 2015-12-28 LAB — BASIC METABOLIC PANEL
Anion gap: 8 (ref 5–15)
BUN: 24 mg/dL — ABNORMAL HIGH (ref 6–20)
CO2: 16 mmol/L — ABNORMAL LOW (ref 22–32)
Calcium: 6.9 mg/dL — ABNORMAL LOW (ref 8.9–10.3)
Chloride: 113 mmol/L — ABNORMAL HIGH (ref 101–111)
Creatinine, Ser: 1.16 mg/dL (ref 0.61–1.24)
GFR calc Af Amer: >60 mL/min (ref >60)
GFR calc non Af Amer: 53 mL/min — ABNORMAL LOW (ref >60)
Glucose, Bld: 96 mg/dL (ref 65–99)
Potassium: 3.8 mmol/L (ref 3.5–5.1)
Sodium: 137 mmol/L (ref 135–145)

## 2015-12-28 LAB — MAGNESIUM: Magnesium: 1.8 mg/dL (ref 1.7–2.4)

## 2015-12-28 LAB — URINE MICROSCOPIC-ADD ON

## 2015-12-28 LAB — PHOSPHORUS: Phosphorus: 2.6 mg/dL (ref 2.5–4.6)

## 2015-12-28 LAB — HEPATIC FUNCTION PANEL
ALT: 18 U/L (ref 17–63)
AST: 22 U/L (ref 15–41)
Albumin: 3.2 g/dL — ABNORMAL LOW (ref 3.5–5.0)
Alkaline Phosphatase: 70 U/L (ref 38–126)
Bilirubin, Direct: 0.2 mg/dL (ref 0.1–0.5)
Indirect Bilirubin: 0.9 mg/dL (ref 0.3–0.9)
Total Bilirubin: 1.1 mg/dL (ref 0.3–1.2)
Total Protein: 6 g/dL — ABNORMAL LOW (ref 6.5–8.1)

## 2015-12-28 MED ORDER — ENOXAPARIN SODIUM 40 MG/0.4ML ~~LOC~~ SOLN: 40.0000 mg | SUBCUTANEOUS | Status: DC

## 2015-12-28 NOTE — Discharge Summary (Signed)
Physician Discharge Summary  Patient ID: Billy Banks MRN: [id number] DOB/AGE: 1922-07-30 82 y.o.  Admit date: 12/25/2015 Discharge date: 12/28/2015  Admission Diagnoses:  Discharge Diagnoses:  Principal Problem:   Elevated serum creatinine Active Problems:   Late onset Alzheimer's disease without behavioral disturbance   Essential hypertension   Adult hypothyroidism   Dehydration   Diarrhea   Leukocytosis   Discharged Condition: stable  Hospital Course: 82 year old male with a past medical history significant for dementia, hypothyroidism and HTN who presents with weakness and diarrhea.  The patient was said to be in his usual state of health until about two days prior to presentation when he developed abdominal discomfort, nausea, vomiting and frequent diarrhea. The diarrhea yesterday was said to be severe, and about bowel movements daily. Patient was weak, and wasn't having enough oral intake. Patient was admitted for further assessment and management. Work up of the diarrhea revealed positive rotavirus. Patient goes to a group place some days in a week. Till date no sick contact is known. Patient was volume resuscitated. Diarrhea resolved with supportive care. Leukocytosis is noted prior to discharge. UA is non revealing. CXR is non revealing. Patient is afebrile. Patient will be discharged back home today to the care of the PCP, and will need repeat CBC within a week to ensure resolution of the leukocytosis.  Consults: None  Significant Diagnostic Studies: Stool was positive for Rotavirus  Discharge medication - See Med Rec  Discharge Exam: Blood pressure 152/73, pulse 74, temperature 98.4 F (36.9 C), temperature source Oral, resp. rate 18, height 5\' 7"  (1.702 m), weight 82 kg (180 lb 12.4 oz), SpO2 94 %.  Disposition: 01-Home or Self Care  Discharge Instructions    Call MD for:    Complete by:  As directed   Call MD with worsening symptoms     Diet - low sodium heart  healthy    Complete by:  As directed      Discharge instructions    Complete by:  As directed   Follow up with PCP in one week. Repeat CBC in PCP's office in one week. Needs 24 hours supervision     Increase activity slowly    Complete by:  As directed   As tolerated            Medication List    TAKE these medications        acetaminophen 325 MG tablet  Commonly known as:  TYLENOL  Take 650 mg by mouth every 8 (eight) hours as needed for mild pain, moderate pain or headache.     amLODipine 5 MG tablet  Commonly known as:  NORVASC  Take 5 mg by mouth daily.     aspirin 81 MG tablet  Take 81 mg by mouth daily.     CALCIUM 600 + D PO  Take 1 tablet by mouth 2 (two) times daily.     FISH OIL PO  Take 1,000 mg by mouth 2 (two) times daily.     galantamine 12 MG tablet  Commonly known as:  RAZADYNE  Take 1 tablet (12 mg total) by mouth 2 (two) times daily.     levothyroxine 88 MCG tablet  Commonly known as:  SYNTHROID, LEVOTHROID  Take 88 mcg by mouth daily before breakfast.     Memantine HCl ER 7 & 14 & 21 &28 MG Cp24  Commonly known as:  NAMENDA XR TITRATION PACK  Take as directed.     PRESERVISION  AREDS 2 PO  Take 1 tablet by mouth 2 (two) times daily.           Follow-up Information    Follow up with [facility] In 1 week.   Why:  Repeat CBC in PCP's office   Contact information:   [place] [address] [id number]       Signed: Bonnell Public 12/28/2015, 1:39 PM

## 2015-12-28 NOTE — Evaluation (Signed)
Physical Therapy Evaluation Patient Details Name: Billy Banks MRN: [id number] DOB: 05/04/1923 Today's Date: 12/28/2015   History of Present Illness  Billy Banks is a 82 y.o. male with a past medical history significant for dementia, hypothyroidism and HTN who presents with weakness and diarrhea.  Clinical Impression  Pt admitted as above and presenting with functional mobility limitations 2* generalized weakness and ambulatory balance deficits.  Pt plans dc to home with assist of dtr and senior centre Tues to Fri..    Follow Up Recommendations No PT follow up    Equipment Recommendations  None recommended by PT    Recommendations for Other Services       Precautions / Restrictions Precautions Precautions: Fall Restrictions Weight Bearing Restrictions: No      Mobility  Bed Mobility Overal bed mobility: Modified Independent Bed Mobility: Sit to Supine;Supine to Sit     Supine to sit: Modified independent (Device/Increase time) Sit to supine: Modified independent (Device/Increase time)   General bed mobility comments: increased time and effort  Transfers Overall transfer level: Needs assistance Equipment used: Rolling walker (2 wheeled) Transfers: Sit to/from Stand Sit to Stand: Supervision         General transfer comment: cues for use of UEs and saftey with transition position  Ambulation/Gait Ambulation/Gait assistance: Min guard Ambulation Distance (Feet): 60 Feet (twice) Assistive device: Rolling walker (2 wheeled) Gait Pattern/deviations: Step-through pattern;Decreased step length - right;Decreased step length - left;Shuffle;Trunk flexed Gait velocity: decr   General Gait Details: Multiple standing rests 2* fatigue to complete task.  Cues for posture and position from RW (Pt used to using 4wh RW)  Stairs            Wheelchair Mobility    Modified Rankin (Stroke Patients Only)       Balance Overall balance assessment: Needs  assistance Sitting-balance support: No upper extremity supported;Feet supported Sitting balance-Leahy Scale: Good     Standing balance support: Bilateral upper extremity supported Standing balance-Leahy Scale: Fair                               Pertinent Vitals/Pain Pain Assessment: No/denies pain    Home Living Family/patient expects to be discharged to:: Private residence Living Arrangements: Children Available Help at Discharge: Family Type of Home: Mobile home Home Access: Ramped entrance     Home Layout: One level Home Equipment: Wheelchair - [employer] - 4 wheels;Walker - 2 wheels Additional Comments: goes to senior center; staff at senior center assists with showers 2x/week    Prior Function Level of Independence: Independent with assistive device(s)         Comments: uses Rollator in the house and w/c in the community     Hand Dominance   Dominant Hand: Right    Extremity/Trunk Assessment   Upper Extremity Assessment: Overall WFL for tasks assessed           Lower Extremity Assessment: Generalized weakness      Cervical / Trunk Assessment: Kyphotic  Communication   Communication: No difficulties  Cognition Arousal/Alertness: Awake/alert Behavior During Therapy: WFL for tasks assessed/performed Overall Cognitive Status: History of cognitive impairments - at baseline                      General Comments      Exercises        Assessment/Plan    PT Assessment Patient needs continued PT services  PT Diagnosis Difficulty walking   PT Problem List Decreased activity tolerance;Decreased balance;Decreased mobility;Decreased knowledge of use of DME;Decreased safety awareness  PT Treatment Interventions DME instruction;Gait training;Functional mobility training;Therapeutic activities;Therapeutic exercise;Balance training;Patient/family education   PT Goals (Current goals can be found in the Care Plan section) Acute Rehab  PT Goals Patient Stated Goal: to get in better shape PT Goal Formulation: With patient Time For Goal Achievement: 01/11/16 Potential to Achieve Goals: Good    Frequency Min 3X/week   Barriers to discharge        Co-evaluation PT/OT/SLP Co-Evaluation/Treatment: Yes Reason for Co-Treatment: For patient/therapist safety PT goals addressed during session: Mobility/safety with mobility OT goals addressed during session: ADL's and self-care       End of Session Equipment Utilized During Treatment: Gait belt Activity Tolerance: Patient tolerated treatment well;Patient limited by fatigue Patient left: in bed;with call bell/phone within reach;with bed alarm set Nurse Communication: Mobility status         Time: [id number] PT Time Calculation (min) (ACUTE ONLY): 23 min   Charges:   PT Evaluation $PT Eval Low Complexity: 1 Procedure     PT G Codes:        Eliot Popper 01-05-16, 11:15 AM

## 2015-12-28 NOTE — Progress Notes (Signed)
Occupational Therapy Evaluation Patient Details Name: Billy Banks MRN: [id number] DOB: 30-Apr-1923 Today's Date: 12/28/2015    History of Present Illness Billy Banks is a 82 y.o. male with a past medical history significant for dementia, hypothyroidism and HTN who presents with weakness and diarrhea.   Clinical Impression   Patient presents to OT likely at baseline for ADLs. No further OT needs identified. Will sign off.    Follow Up Recommendations  No OT follow up;Supervision/Assistance - 24 hour    Equipment Recommendations  None recommended by OT    Recommendations for Other Services       Precautions / Restrictions Precautions Precautions: Fall Restrictions Weight Bearing Restrictions: No      Mobility Bed Mobility Overal bed mobility: Modified Independent Bed Mobility: Sit to Supine;Supine to Sit     Supine to sit: Modified independent (Device/Increase time) Sit to supine: Modified independent (Device/Increase time)   General bed mobility comments: increased time and effort  Transfers Overall transfer level: Needs assistance Equipment used: Rolling walker (2 wheeled) Transfers: Sit to/from Stand Sit to Stand: Supervision         General transfer comment: cues to stay close to RW during ambulation; pt uses Rollator at home    Balance                                            ADL Overall ADL's : At baseline                                       General ADL Comments: Patient able to self-feed, reach feet, ambulate to bathroom, toilet.     Vision     Perception     Praxis      Pertinent Vitals/Pain Pain Assessment: No/denies pain     Hand Dominance Right   Extremity/Trunk Assessment Upper Extremity Assessment Upper Extremity Assessment: Overall WFL for tasks assessed   Lower Extremity Assessment Lower Extremity Assessment: Defer to PT evaluation   Cervical / Trunk Assessment Cervical /  Trunk Assessment: Kyphotic   Communication Communication Communication: No difficulties   Cognition Arousal/Alertness: Awake/alert Behavior During Therapy: WFL for tasks assessed/performed Overall Cognitive Status: History of cognitive impairments - at baseline                     General Comments       Exercises       Shoulder Instructions      Home Living Family/patient expects to be discharged to:: Private residence Living Arrangements: Children Available Help at Discharge: Family Type of Home: Mobile home Home Access: Ramped entrance     Home Layout: One level     Bathroom Shower/Tub: Other (comment) (sponge bathes at home)   Bathroom Toilet: Handicapped height     Home Equipment: Wheelchair - [employer] - 4 wheels;Walker - 2 wheels   Additional Comments: goes to senior center; staff at senior center assists with showers 2x/week      Prior Functioning/Environment Level of Independence: Independent with assistive device(s)        Comments: uses Rollator in the house and w/c in the community    OT Diagnosis: Generalized weakness   OT Problem List: Decreased strength;Decreased activity tolerance;Impaired balance (sitting and/or standing)   OT Treatment/Interventions:  OT Goals(Current goals can be found in the care plan section) Acute Rehab OT Goals Patient Stated Goal: to get in better shape OT Goal Formulation: All assessment and education complete, DC therapy  OT Frequency:     Barriers to D/C:            Co-evaluation PT/OT/SLP Co-Evaluation/Treatment: Yes Reason for Co-Treatment: For patient/therapist safety PT goals addressed during session: Mobility/safety with mobility OT goals addressed during session: ADL's and self-care      End of Session Equipment Utilized During Treatment: Gait belt;Rolling walker Nurse Communication: Mobility status  Activity Tolerance: Patient tolerated treatment well Patient left: in bed;with  call bell/phone within reach;with bed alarm set   Time: [phone] OT Time Calculation (min): 23 min Charges:  OT General Charges $OT Visit: 1 Procedure OT Evaluation $OT Eval Low Complexity: 1 Procedure G-Codes: OT G-codes **NOT FOR INPATIENT CLASS** Functional Assessment Tool Used: clinical judgment Functional Limitation: Self care Self Care Current Status [id number]): At least 1 percent but less than 20 percent impaired, limited or restricted Self Care Goal Status [id number]): At least 1 percent but less than 20 percent impaired, limited or restricted Self Care Discharge Status [phone]): At least 1 percent but less than 20 percent impaired, limited or restricted  Billy Banks A 12/28/2015, 8:26 AM

## 2015-12-28 NOTE — Progress Notes (Signed)
Discharge instructions reviewed with patients daughter Earley Abide. Patients daughter verbalized understanding.

## 2015-12-28 NOTE — Care Management Note (Signed)
Case Management Note  Patient Details  Name: Tylynn Dacquisto MRN: [id number] Date of Birth: [birth year]  Subjective/Objective:   82 yo admitted with Elevated serum creatinine                 Action/Plan: From home with daughter.  PT/OT evaluated pt and did not recommend any PT/OT follow up at discharge.  Pt has Wheelchair, Environmental consultant, Bedside commo- de/3-in-1 at home.  No DC needs communicated.  Expected Discharge Date:  12/29/15               Expected Discharge Plan:  Home/Self Care  In-House Referral:     Discharge planning Services  CM Consult  Post Acute Care Choice:    Choice offered to:     DME Arranged:    DME Agency:     HH Arranged:    HH Agency:     Status of Service:  Completed, signed off  Medicare Important Message Given:    Date Medicare IM Given:    Medicare IM give by:    Date Additional Medicare IM Given:    Additional Medicare Important Message give by:     If discussed at [place] of Stay Meetings, dates discussed:    Additional CommentsLynnell Catalan, RN 12/28/2015, 1:50 PM [phone]

## 2016-01-04 ENCOUNTER — Telehealth: Payer: Self-pay | Admitting: Neurology

## 2016-01-04 MED ORDER — MEMANTINE HCL ER 28 MG PO CP24: 28.0000 mg | ORAL_CAPSULE | Freq: Every day | ORAL | Status: DC

## 2016-01-04 NOTE — Telephone Encounter (Signed)
RX sent in

## 2016-01-04 NOTE — Telephone Encounter (Signed)
Jariah Fischel Nov 03, 2024. His daughter called in needing to get a prescription refilled for him Nomenda 28 MG. He uses [facility] on [address]. He only has a few left. Her number is [phone] or [phone]. Thank you

## 2016-04-28 ENCOUNTER — Other Ambulatory Visit: Payer: Self-pay | Admitting: Neurology

## 2016-05-05 ENCOUNTER — Observation Stay (HOSPITAL_COMMUNITY)
Admission: EM | Admit: 2016-05-05 | Discharge: 2016-05-06 | Disposition: A | Discharge status: Home / Self Care | Payer: Medicare Other | Attending: Family Medicine | Admitting: Family Medicine

## 2016-05-05 ENCOUNTER — Emergency Department (HOSPITAL_COMMUNITY): Payer: Medicare Other

## 2016-05-05 ENCOUNTER — Encounter (HOSPITAL_COMMUNITY): Payer: Self-pay

## 2016-05-05 DIAGNOSIS — M25562 Pain in left knee: Secondary | ICD-10-CM | POA: Diagnosis present

## 2016-05-05 DIAGNOSIS — G301 Alzheimer's disease with late onset: Secondary | ICD-10-CM | POA: Insufficient documentation

## 2016-05-05 DIAGNOSIS — I129 Hypertensive chronic kidney disease with stage 1 through stage 4 chronic kidney disease, or unspecified chronic kidney disease: Secondary | ICD-10-CM | POA: Diagnosis not present

## 2016-05-05 DIAGNOSIS — N183 Chronic kidney disease, stage 3 (moderate): Secondary | ICD-10-CM | POA: Diagnosis not present

## 2016-05-05 DIAGNOSIS — Z87891 Personal history of nicotine dependence: Secondary | ICD-10-CM | POA: Insufficient documentation

## 2016-05-05 DIAGNOSIS — E039 Hypothyroidism, unspecified: Secondary | ICD-10-CM | POA: Diagnosis not present

## 2016-05-05 DIAGNOSIS — Z7982 Long term (current) use of aspirin: Secondary | ICD-10-CM | POA: Insufficient documentation

## 2016-05-05 DIAGNOSIS — Z23 Encounter for immunization: Secondary | ICD-10-CM | POA: Diagnosis not present

## 2016-05-05 DIAGNOSIS — Z96653 Presence of artificial knee joint, bilateral: Secondary | ICD-10-CM | POA: Diagnosis not present

## 2016-05-05 DIAGNOSIS — D72829 Elevated white blood cell count, unspecified: Secondary | ICD-10-CM | POA: Insufficient documentation

## 2016-05-05 DIAGNOSIS — Z79899 Other long term (current) drug therapy: Secondary | ICD-10-CM | POA: Insufficient documentation

## 2016-05-05 DIAGNOSIS — M25462 Effusion, left knee: Secondary | ICD-10-CM | POA: Diagnosis not present

## 2016-05-05 DIAGNOSIS — I10 Essential (primary) hypertension: Secondary | ICD-10-CM | POA: Diagnosis not present

## 2016-05-05 DIAGNOSIS — F0280 Dementia in other diseases classified elsewhere without behavioral disturbance: Secondary | ICD-10-CM | POA: Insufficient documentation

## 2016-05-05 DIAGNOSIS — Z66 Do not resuscitate: Secondary | ICD-10-CM | POA: Insufficient documentation

## 2016-05-05 DIAGNOSIS — Z8546 Personal history of malignant neoplasm of prostate: Secondary | ICD-10-CM | POA: Insufficient documentation

## 2016-05-05 DIAGNOSIS — R262 Difficulty in walking, not elsewhere classified: Secondary | ICD-10-CM

## 2016-05-05 LAB — CBC WITH DIFFERENTIAL/PLATELET
BASOS PCT: 1 %
Basophils Absolute: 0.2 10*3/uL — ABNORMAL HIGH (ref 0.0–0.1)
EOS PCT: 1 %
Eosinophils Absolute: 0.2 10*3/uL (ref 0.0–0.7)
HEMATOCRIT: 47.7 % (ref 39.0–52.0)
Hemoglobin: 14.1 g/dL (ref 13.0–17.0)
LYMPHS ABS: 2.3 10*3/uL (ref 0.7–4.0)
Lymphocytes Relative: 15 %
MCH: 21 pg — AB (ref 26.0–34.0)
MCHC: 29.6 g/dL — ABNORMAL LOW (ref 30.0–36.0)
MCV: 71 fL — AB (ref 78.0–100.0)
MONO ABS: 1.4 10*3/uL — AB (ref 0.1–1.0)
Monocytes Relative: 9 %
NEUTROS PCT: 74 %
Neutro Abs: 11.5 10*3/uL — ABNORMAL HIGH (ref 1.7–7.7)
PLATELETS: 780 10*3/uL — AB (ref 150–400)
RBC: 6.72 MIL/uL — ABNORMAL HIGH (ref 4.22–5.81)
RDW: 19.1 % — ABNORMAL HIGH (ref 11.5–15.5)
WBC: 15.6 10*3/uL — ABNORMAL HIGH (ref 4.0–10.5)

## 2016-05-05 LAB — BASIC METABOLIC PANEL
Anion gap: 6 (ref 5–15)
BUN: 26 mg/dL — ABNORMAL HIGH (ref 6–20)
CALCIUM: 8.1 mg/dL — AB (ref 8.9–10.3)
CO2: 24 mmol/L (ref 22–32)
Chloride: 109 mmol/L (ref 101–111)
Creatinine, Ser: 1.27 mg/dL — ABNORMAL HIGH (ref 0.61–1.24)
GFR calc Af Amer: 55 mL/min — ABNORMAL LOW (ref >60)
GFR, EST NON AFRICAN AMERICAN: 48 mL/min — AB (ref >60)
GLUCOSE: 107 mg/dL — AB (ref 65–99)
POTASSIUM: 4.3 mmol/L (ref 3.5–5.1)
Sodium: 139 mmol/L (ref 135–145)

## 2016-05-05 LAB — GRAM STAIN

## 2016-05-05 LAB — SYNOVIAL CELL COUNT + DIFF, W/ CRYSTALS
CRYSTALS FLUID: NONE SEEN
Eosinophils-Synovial: 2 % — ABNORMAL HIGH (ref 0–1)
LYMPHOCYTES-SYNOVIAL FLD: 10 % (ref 0–20)
MONOCYTE-MACROPHAGE-SYNOVIAL FLUID: 13 % — AB (ref 50–90)
Neutrophil, Synovial: 75 % — ABNORMAL HIGH (ref 0–25)
WBC, Synovial: 11250 /mm3 — ABNORMAL HIGH (ref 0–200)

## 2016-05-05 MED ORDER — ACETAMINOPHEN 325 MG PO TABS: 650.0000 mg | ORAL_TABLET | Freq: Four times a day (QID) | ORAL | Status: DC | PRN

## 2016-05-05 MED ORDER — ONDANSETRON HCL 4 MG/2ML IJ SOLN: 4.0000 mg | Freq: Four times a day (QID) | INTRAMUSCULAR | Status: DC | PRN

## 2016-05-05 MED ORDER — INFLUENZA VAC SPLIT QUAD 0.5 ML IM SUSY
0.5000 mL | PREFILLED_SYRINGE | INTRAMUSCULAR | Status: AC
  Administered 2016-05-06: 0.5 mL via INTRAMUSCULAR
  Filled 2016-05-05: qty 0.5

## 2016-05-05 MED ORDER — MEMANTINE HCL ER 28 MG PO CP24
28.0000 mg | ORAL_CAPSULE | Freq: Every day | ORAL | Status: DC
  Administered 2016-05-06: 28 mg via ORAL
  Filled 2016-05-05 (×2): qty 1

## 2016-05-05 MED ORDER — HEPARIN SODIUM (PORCINE) 5000 UNIT/ML IJ SOLN
5000.0000 [IU] | Freq: Three times a day (TID) | INTRAMUSCULAR | Status: DC
  Administered 2016-05-05 – 2016-05-06 (×3): 5000 [IU] via SUBCUTANEOUS
  Filled 2016-05-05 (×3): qty 1

## 2016-05-05 MED ORDER — HYDROCODONE-ACETAMINOPHEN 5-325 MG PO TABS
1.0000 | ORAL_TABLET | ORAL | Status: DC | PRN
  Administered 2016-05-06: 1 via ORAL
  Filled 2016-05-05: qty 2

## 2016-05-05 MED ORDER — LEVOTHYROXINE SODIUM 88 MCG PO TABS
88.0000 ug | ORAL_TABLET | Freq: Every day | ORAL | Status: DC
  Administered 2016-05-06: 88 ug via ORAL
  Filled 2016-05-05: qty 1

## 2016-05-05 MED ORDER — LIDOCAINE-EPINEPHRINE (PF) 2 %-1:200000 IJ SOLN
10.0000 mL | Freq: Once | INTRAMUSCULAR | Status: AC
  Administered 2016-05-05: 10 mL
  Filled 2016-05-05: qty 10

## 2016-05-05 MED ORDER — ONDANSETRON HCL 4 MG PO TABS: 4.0000 mg | ORAL_TABLET | Freq: Four times a day (QID) | ORAL | Status: DC | PRN

## 2016-05-05 MED ORDER — BISACODYL 10 MG RE SUPP: 10.0000 mg | Freq: Every day | RECTAL | Status: DC | PRN

## 2016-05-05 MED ORDER — LIDOCAINE HCL 2 % IJ SOLN
20.0000 mL | Freq: Once | INTRAMUSCULAR | Status: AC
  Administered 2016-05-05: 400 mg

## 2016-05-05 MED ORDER — OMEGA-3-ACID ETHYL ESTERS 1 G PO CAPS
1.0000 g | ORAL_CAPSULE | Freq: Two times a day (BID) | ORAL | Status: DC
  Administered 2016-05-05 – 2016-05-06 (×2): 1 g via ORAL
  Filled 2016-05-05 (×2): qty 1

## 2016-05-05 MED ORDER — AMLODIPINE BESYLATE 5 MG PO TABS
5.0000 mg | ORAL_TABLET | Freq: Every day | ORAL | Status: DC
  Administered 2016-05-06: 5 mg via ORAL
  Filled 2016-05-05 (×2): qty 1

## 2016-05-05 MED ORDER — FLUTICASONE PROPIONATE 50 MCG/ACT NA SUSP
1.0000 | Freq: Every day | NASAL | Status: DC | PRN
  Filled 2016-05-05: qty 16

## 2016-05-05 MED ORDER — TRIAMCINOLONE ACETONIDE 10 MG/ML IJ SUSP
10.0000 mg | Freq: Once | INTRAMUSCULAR | Status: DC
Start: 2016-05-05 — End: 2016-05-05
  Filled 2016-05-05: qty 1

## 2016-05-05 MED ORDER — ACETAMINOPHEN 650 MG RE SUPP: 650.0000 mg | Freq: Four times a day (QID) | RECTAL | Status: DC | PRN

## 2016-05-05 MED ORDER — TRIAMCINOLONE ACETONIDE 40 MG/ML IJ SUSP
10.0000 mg | Freq: Once | INTRAMUSCULAR | Status: AC
  Administered 2016-05-05: 10 mg via INTRAMUSCULAR
  Filled 2016-05-05: qty 0.25

## 2016-05-05 MED ORDER — ASPIRIN 81 MG PO CHEW
81.0000 mg | CHEWABLE_TABLET | Freq: Every day | ORAL | Status: DC
  Administered 2016-05-06: 81 mg via ORAL
  Filled 2016-05-05 (×3): qty 1

## 2016-05-05 MED ORDER — GALANTAMINE HYDROBROMIDE 4 MG PO TABS
12.0000 mg | ORAL_TABLET | Freq: Two times a day (BID) | ORAL | Status: DC
  Administered 2016-05-05 – 2016-05-06 (×2): 12 mg via ORAL
  Filled 2016-05-05 (×3): qty 3

## 2016-05-05 MED ORDER — LIDOCAINE HCL 2 % IJ SOLN
INTRAMUSCULAR | Status: AC
  Administered 2016-05-05: 400 mg
  Filled 2016-05-05: qty 20

## 2016-05-05 MED ORDER — POLYETHYLENE GLYCOL 3350 17 G PO PACK: 17.0000 g | PACK | Freq: Every day | ORAL | Status: DC | PRN

## 2016-05-05 MED ORDER — SENNA 8.6 MG PO TABS
1.0000 | ORAL_TABLET | Freq: Two times a day (BID) | ORAL | Status: DC
  Administered 2016-05-05 – 2016-05-06 (×2): 8.6 mg via ORAL
  Filled 2016-05-05 (×2): qty 1

## 2016-05-05 NOTE — ED Triage Notes (Signed)
Per EMS pt complaint of acute chronic left knee pain worse with movement. Pt hx of bilateral knee replacements. With triage pt verbalizes left knee pain when walking to restroom at midnight. Pt denies injury. Pt denies pain with laying in stretcher but reports pain moderate with movement.

## 2016-05-05 NOTE — ED Notes (Signed)
Bed: [id number] Expected date: 05/05/16 Expected time: 8:23 AM Means of arrival: Ambulance Comments: Knee pain

## 2016-05-05 NOTE — Progress Notes (Signed)
CSW met with patient at bedside, patient's daughter was present Billy Banks). Patient was oriented x4. Patient's daughter reported that patient moved in with her about 1 year and half ago after experiencing numerous falls. Patient's daughter reported that the patient was recently diagnosed with Alzheimer's disease.  Patient's daughter reported that patient attends [facility] Monday - Friday an Adult center for enrichment where the patient is bathed in a safe environment. Patient reported that he enjoys going to [facility]. Patient noted that he exercises 2x/day at the facility and gets along well with the other people that attend the facility.   Patient reported that he is really grateful that his daughter is taking care of him and that she does a good job caring for him. Patient's daughter reported that she has POA over patient and requested that CSW contact her if the patient is found to need more extensive care. Patient's daughter provided CSW with contact numbers (home [phone] preference*) (cell [phone]). Patient's daughter noted that if CSW is unable to reach her that CSW could speak with patient's daughter's husband Lavella Lemons at the provided numbers above. CSW agreed to contact patient's daughter tomorrow if extensive care needs arise following PT evaluation. CSW inquired if patient and or patient's daughter had any questions or concerns, patient and patient's daughter replied no.

## 2016-05-05 NOTE — H&P (Signed)
TRH H&P   Patient Demographics:    Billy Banks, is a 82 y.o. male  MRN: [id number]   DOB - 1923/06/17  Admit Date - 05/05/2016  Outpatient Primary MD for the patient is [facility]  Referring MD/NP/PA: Dr Lacinda Axon  Patient coming from: Home  Chief Complaint  Patient presents with  . Knee Pain      HPI:    Billy Banks  is a 82 y.o. male, with a past medical history significant for dementia, hypothyroidism , Alzheimer's dementia and HTN , who presents to ED with complaints of left knee pain, patient lives with his daughter at home, usually ambulates with a walker, occasionally with wheelchair, reports he woke up from sleep this morning, complaining of left knee pain, unable to stand up, thickened due to pain, EMS brought him to ED, workup was significant for left knee effusion on x-ray, agent is afebrile, denies any fever or chills, as per daughter does not have any altered mental status or loss of appetite, appears at baseline, patient status post left total knee replacement 10 years ago, no complications since. - IN ED patient had arthrocentesis by ED physician with 100 mL of bloody fluid drained, as well he had steroid injection, patient still complaining of pain, unable to stand up secondary to pain and unsteady gait, hospitalist requested to admit for further management.    Review of systems:    In addition to the HPI above,  No Fever-chills, No Headache, No changes with Vision or hearing, No problems swallowing food or Liquids, No Chest pain, Cough or Shortness of Breath, No Abdominal pain, No Nausea or Vommitting, Bowel movements are regular, No Blood in stool or Urine, No dysuria, No new skin rashes or bruises, Planes of left knee pain and swelling No new weakness, tingling, numbness in any extremity, No recent weight gain or loss, No polyuria, polydypsia or  polyphagia, Patient with dementia  A full 10 point Review of Systems was done, except as stated above, all other Review of Systems were negative.   With Past History of the following :    Past Medical History:  Diagnosis Date  . Adult hypothyroidism 06/24/2015  . Alzheimer disease   . Bright red blood per rectum 06/24/2015  . Cancer [facility])    prostate  . Essential hypertension 05/02/2015  . Excessive falling 08/08/2014  . GERD (gastroesophageal reflux disease)   . H/O malignant neoplasm of prostate 06/24/2015  . History of small bowel obstruction 11/17/2013  . History of small bowel obstruction 11/17/2013  . HLD (hyperlipidemia) 06/24/2015  . Hyperlipidemia   . Hypertension   . Late onset Alzheimer's disease without behavioral disturbance 05/02/2015  . Left anterior fascicular block 06/24/2015  . Macular degeneration   . Osteoporosis 06/24/2015  . Spinal stenosis of lumbar region   . Thyroid disease   . Weakness 06/23/2015  Past Surgical History:  Procedure Laterality Date  . EYE SURGERY    . knee replacement surgery Bilateral   . PROSTATECTOMY        Social History:     Social History  Substance Use Topics  . Smoking status: Former Research scientist (life sciences)  . Smokeless tobacco: Never Used  . Alcohol use No     Lives - At home with daughter  Mobility - with cane and walker, as well as wheelchair at home, but they are unable to use wheelchair as they live in a trailer .      Family History :     Family History  Problem Relation Age of Onset  . Adopted: Yes      Home Medications:   Prior to Admission medications   Medication Sig Start Date End Date Taking? Authorizing Provider  acetaminophen (TYLENOL) 325 MG tablet Take 650 mg by mouth every 8 (eight) hours as needed for mild pain, moderate pain or headache.   Yes Historical Provider, MD  amLODipine (NORVASC) 5 MG tablet Take 5 mg by mouth daily.   Yes Historical Provider, MD  aspirin 81 MG tablet Take 81 mg by mouth  daily.   Yes Historical Provider, MD  fluticasone (FLONASE) 50 MCG/ACT nasal spray Place 1 spray into both nostrils daily as needed for allergies or rhinitis.   Yes Historical Provider, MD  galantamine (RAZADYNE) 12 MG tablet Take 1 tablet (12 mg total) by mouth 2 (two) times daily. 08/24/15  Yes Adam Telford Nab, DO  levothyroxine (SYNTHROID, LEVOTHROID) 88 MCG tablet Take 88 mcg by mouth daily before breakfast.   Yes Historical Provider, MD  Multiple Vitamins-Minerals (PRESERVISION AREDS 2 PO) Take 1 tablet by mouth 2 (two) times daily.   Yes Historical Provider, MD  NAMENDA XR 28 MG CP24 24 hr capsule TAKE 1 CAPSULE (28 MG TOTAL) BY MOUTH DAILY. 04/30/16  Yes Pieter Partridge, DO  NAPROXEN EX Apply 1 application topically daily as needed (arthritis pain).   Yes Historical Provider, MD  Omega-3 Fatty Acids (FISH OIL PO) Take 1,000 mg by mouth 2 (two) times daily.    Yes Historical Provider, MD     Allergies:    No Known Allergies   Physical Exam:   Vitals  Blood pressure 138/77, pulse 77, temperature 98.6 F (37 C), temperature source Oral, resp. rate 16, SpO2 95 %.   1. General Frail elderly male lying in bed in NAD,    2. Pleasant, communicative, appropriate, but appears to be with mild dementia, awake alert time to .   3. No F.N deficits, ALL C.Nerves Intact, Strength 5/5 all 4 extremities, Sensation intact all 4 extremities, Plantars down going.  4. Ears and Eyes appear Normal, Conjunctivae clear, PERRLA. Moist Oral Mucosa.  5. Supple Neck, No JVD, No cervical lymphadenopathy appriciated, No Carotid Bruits.  6. Symmetrical Chest wall movement, Good air movement bilaterally, CTAB.  7. RRR, No Gallops, Rubs or Murmurs, No Parasternal Heave, +1edema B/L  8. Positive Bowel Sounds, Abdomen Soft, No tenderness, No organomegaly appriciated,No rebound -guarding or rigidity.  9.  No Cyanosis, Normal Skin Turgor, No Skin Rash or Bruise.  10. Good muscle tone,  joints appear normal , left  knee with Ace wrap, could not be examined.  11. No Palpable Lymph Nodes in Neck or Axillae     Data Review:    CBC  Recent Labs Lab 05/05/16 1345  WBC 15.6*  HGB 14.1  HCT 47.7  PLT 780*  MCV  71.0*  MCH 21.0*  MCHC 29.6*  RDW 19.1*  LYMPHSABS 2.3  MONOABS 1.4*  EOSABS 0.2  BASOSABS 0.2*   ------------------------------------------------------------------------------------------------------------------  Chemistries   Recent Labs Lab 05/05/16 1345  NA 139  K 4.3  CL 109  CO2 24  GLUCOSE 107*  BUN 26*  CREATININE 1.27*  CALCIUM 8.1*   ------------------------------------------------------------------------------------------------------------------ CrCl cannot be calculated (Unknown ideal weight.). ------------------------------------------------------------------------------------------------------------------ No results for input(s): TSH, T4TOTAL, T3FREE, THYROIDAB in the last 72 hours.  Invalid input(s): FREET3  Coagulation profile No results for input(s): INR, PROTIME in the last 168 hours. ------------------------------------------------------------------------------------------------------------------- No results for input(s): DDIMER in the last 72 hours. -------------------------------------------------------------------------------------------------------------------  Cardiac Enzymes No results for input(s): CKMB, TROPONINI, MYOGLOBIN in the last 168 hours.  Invalid input(s): CK ------------------------------------------------------------------------------------------------------------------ No results found for: BNP   ---------------------------------------------------------------------------------------------------------------  Urinalysis    Component Value Date/Time   COLORURINE YELLOW 12/28/2015 1300   APPEARANCEUR CLEAR 12/28/2015 1300   LABSPEC 1.017 12/28/2015 1300   PHURINE 5.0 12/28/2015 1300   GLUCOSEU NEGATIVE 12/28/2015 1300    HGBUR NEGATIVE 12/28/2015 1300   BILIRUBINUR NEGATIVE 12/28/2015 1300   KETONESUR NEGATIVE 12/28/2015 1300   PROTEINUR 30 (A) 12/28/2015 1300   NITRITE NEGATIVE 12/28/2015 1300   LEUKOCYTESUR NEGATIVE 12/28/2015 1300    ----------------------------------------------------------------------------------------------------------------   Imaging Results:    Dg Knee Complete 4 Views Left  Result Date: 05/05/2016 CLINICAL DATA:  Pain in left knee without trauma. Knee surgery 10 years ago. EXAM: LEFT KNEE - COMPLETE 4+ VIEW COMPARISON:  None. FINDINGS: The knee replacement hardware is in good position with no evidence of failure or loosening. No fractures. Vascular calcifications are noted. A moderate to large joint effusion is identified. IMPRESSION: Moderate to large joint effusion. Knee replacement. No other acute abnormalities. Electronically Signed   By: Dorise Bullion III M.D   On: 05/05/2016 11:11    Assessment & Plan:    Active Problems:   Late onset Alzheimer's disease without behavioral disturbance   Essential hypertension   Effusion of left knee  Left knee effusion - Unclear etiology, but unlikely septic, as patient with no fever, chills, he has leukocytosis but that seems to be chronic and does not appear toxic, patient status post arthrocentesis by ED physician, 100 mL drained, and steroid was injected , synovial fluid appears to be bloody per ED physician, so it's possibly traumatic, even though patient denies any trauma but he is demented. - Will follow on sonography fluid cultures, and Gram stain, added cell count with crystals. - Continue with when necessary pain medication - It's causing patient significant unsteady gait and mobility limitation, so we'll consult PT and OT  Dementia - Continue with home medication  Hypertension - Continue amlodipine  Hypothyroidism - Continue Synthroid  CKD stage III - At baseline, continue to monitor  DVT Prophylaxis Heparin -   SCDs  AM Labs Ordered, also please review Full Orders  Family Communication: Admission, patients condition and plan of care including tests being ordered have been discussed with the patient and Daughter who indicate understanding and agree with the plan and Code Status.  Code Status DO NOT RESUSCITATE  Likely DC to  Home Vs SNF, Pending PT consult  Condition GUARDED    Consults called: none  Admission status: Observation  Time spent in minutes : 50 miutes   Diontay Rosencrans M.D on 05/05/2016 at 4:52 PM  Between 7am to 7pm - Pager - [phone]. After 7pm go to [email or web address] - password [email or web address]  [facility] -  Office  [phone]

## 2016-05-05 NOTE — ED Notes (Signed)
Pt. returned from XR. 

## 2016-05-05 NOTE — ED Notes (Signed)
Sandwich and ginger ale given

## 2016-05-05 NOTE — ED Notes (Signed)
Cook MD aware procedure set up at bedside.

## 2016-05-05 NOTE — ED Notes (Signed)
Pt denies pain at present time; pain ONLY with wt bearing.

## 2016-05-05 NOTE — ED Notes (Signed)
Awaiting medications from main pharmacy. Then will notify provider procedure set up.

## 2016-05-05 NOTE — ED Notes (Signed)
Cook MD at bedside. Procedure in process.

## 2016-05-05 NOTE — ED Provider Notes (Signed)
[place] DEPT Provider Note   CSN: [id number] Arrival date & time: 05/05/16  0900     History   Chief Complaint Chief Complaint  Patient presents with  . Knee Pain    HPI Billy Banks is a 82 y.o. male.  Level V caveat for dementia. Patient lives at home with his daughter. He he normally can ambulate with his walker. For the past several days his left knee has been bothering him and he has been unable to walk. Status post left total knee replacement greater than 10 years ago in another community.      Past Medical History:  Diagnosis Date  . Adult hypothyroidism 06/24/2015  . Alzheimer disease   . Bright red blood per rectum 06/24/2015  . Cancer [facility])    prostate  . Essential hypertension 05/02/2015  . Excessive falling 08/08/2014  . GERD (gastroesophageal reflux disease)   . H/O malignant neoplasm of prostate 06/24/2015  . History of small bowel obstruction 11/17/2013  . History of small bowel obstruction 11/17/2013  . HLD (hyperlipidemia) 06/24/2015  . Hyperlipidemia   . Hypertension   . Late onset Alzheimer's disease without behavioral disturbance 05/02/2015  . Left anterior fascicular block 06/24/2015  . Macular degeneration   . Osteoporosis 06/24/2015  . Spinal stenosis of lumbar region   . Thyroid disease   . Weakness 06/23/2015    Patient Active Problem List   Diagnosis Date Noted  . Elevated serum creatinine 12/25/2015  . Dehydration 12/25/2015  . Diarrhea 12/25/2015  . Leukocytosis 12/25/2015  . Bright red blood per rectum 06/24/2015  . Osteoporosis 06/24/2015  . Left anterior fascicular block 06/24/2015  . H/O malignant neoplasm of prostate 06/24/2015  . Adult hypothyroidism 06/24/2015  . HLD (hyperlipidemia) 06/24/2015  . Spinal stenosis of lumbar region   . Weakness 06/23/2015  . Late onset Alzheimer's disease without behavioral disturbance 05/02/2015  . Essential hypertension 05/02/2015  . Excessive falling 08/08/2014  . History of  small bowel obstruction 11/17/2013    Past Surgical History:  Procedure Laterality Date  . EYE SURGERY    . knee replacement surgery Bilateral   . PROSTATECTOMY         Home Medications    Prior to Admission medications   Medication Sig Start Date End Date Taking? Authorizing Provider  acetaminophen (TYLENOL) 325 MG tablet Take 650 mg by mouth every 8 (eight) hours as needed for mild pain, moderate pain or headache.   Yes Historical Provider, MD  amLODipine (NORVASC) 5 MG tablet Take 5 mg by mouth daily.   Yes Historical Provider, MD  aspirin 81 MG tablet Take 81 mg by mouth daily.   Yes Historical Provider, MD  fluticasone (FLONASE) 50 MCG/ACT nasal spray Place 1 spray into both nostrils daily as needed for allergies or rhinitis.   Yes Historical Provider, MD  galantamine (RAZADYNE) 12 MG tablet Take 1 tablet (12 mg total) by mouth 2 (two) times daily. 08/24/15  Yes Adam Telford Nab, DO  levothyroxine (SYNTHROID, LEVOTHROID) 88 MCG tablet Take 88 mcg by mouth daily before breakfast.   Yes Historical Provider, MD  Multiple Vitamins-Minerals (PRESERVISION AREDS 2 PO) Take 1 tablet by mouth 2 (two) times daily.   Yes Historical Provider, MD  NAMENDA XR 28 MG CP24 24 hr capsule TAKE 1 CAPSULE (28 MG TOTAL) BY MOUTH DAILY. 04/30/16  Yes Pieter Partridge, DO  NAPROXEN EX Apply 1 application topically daily as needed (arthritis pain).   Yes Historical Provider, MD  Omega-3  Fatty Acids (FISH OIL PO) Take 1,000 mg by mouth 2 (two) times daily.    Yes Historical Provider, MD    Family History Family History  Problem Relation Age of Onset  . Adopted: Yes    Social History Social History  Substance Use Topics  . Smoking status: Former Research scientist (life sciences)  . Smokeless tobacco: Never Used  . Alcohol use No     Allergies   Review of patient's allergies indicates no known allergies.   Review of Systems Review of Systems  Reason unable to perform ROS: mild dementia.     Physical Exam Updated Vital  Signs BP 138/77 (BP Location: Left Arm)   Pulse 77   Temp 98.6 F (37 C) (Oral)   Resp 16   SpO2 95%   Physical Exam  Constitutional:  Pleasant, slightly demented  HENT:  Head: Normocephalic and atraumatic.  Eyes: Conjunctivae are normal.  Neck: Neck supple.  Cardiovascular: Normal rate and regular rhythm.   Pulmonary/Chest: Effort normal and breath sounds normal.  Abdominal: Soft. Bowel sounds are normal.  Musculoskeletal:  Left knee: Tender, puffy, no erythema  Neurological: He is alert.  Skin: Skin is warm and dry.  Psychiatric:  Flat affect  Nursing note and vitals reviewed.    ED Treatments / Results  Labs (all labs ordered are listed, but only abnormal results are displayed) Labs Reviewed  CBC WITH DIFFERENTIAL/PLATELET - Abnormal; Notable for the following:       Result Value   WBC 15.6 (*)    RBC 6.72 (*)    MCV 71.0 (*)    MCH 21.0 (*)    MCHC 29.6 (*)    RDW 19.1 (*)    Platelets 780 (*)    Neutro Abs 11.5 (*)    Monocytes Absolute 1.4 (*)    Basophils Absolute 0.2 (*)    All other components within normal limits  BASIC METABOLIC PANEL - Abnormal; Notable for the following:    Glucose, Bld 107 (*)    BUN 26 (*)    Creatinine, Ser 1.27 (*)    Calcium 8.1 (*)    GFR calc non Af Amer 48 (*)    GFR calc Af Amer 55 (*)    All other components within normal limits  CULTURE, BODY FLUID-BOTTLE  GRAM STAIN  SYNOVIAL CELL COUNT + DIFF, W/ CRYSTALS    EKG  EKG Interpretation None       Radiology Dg Knee Complete 4 Views Left  Result Date: 05/05/2016 CLINICAL DATA:  Pain in left knee without trauma. Knee surgery 10 years ago. EXAM: LEFT KNEE - COMPLETE 4+ VIEW COMPARISON:  None. FINDINGS: The knee replacement hardware is in good position with no evidence of failure or loosening. No fractures. Vascular calcifications are noted. A moderate to large joint effusion is identified. IMPRESSION: Moderate to large joint effusion. Knee replacement. No other  acute abnormalities. Electronically Signed   By: Dorise Bullion III M.D   On: 05/05/2016 11:11    Procedures .Joint Aspiration/Arthrocentesis Date/Time: 05/05/2016 3:00 PM Performed by: Nat Christen Authorized by: Nat Christen   Consent:    Consent obtained:  Verbal   Consent given by:  Patient and guardian   Risks discussed:  Pain, nerve damage, bleeding and infection Location:    Location:  Knee   Knee:  L knee Anesthesia (see MAR for exact dosages):    Anesthesia method:  Local infiltration   Local anesthetic:  Lidocaine 2% WITH epi Procedure details:  Preparation: Patient was prepped and draped in usual sterile fashion     Needle gauge:  18 G   Ultrasound guidance: no     Approach:  Lateral   Aspirate amount:  100 cc   Aspirate characteristics:  Bloody   Steroid injected: yes     Specimen collected: Kenalog 10 mg.   Post-procedure details:    Dressing:  Sterile dressing Comments:     Successful arthrocentesis with approximately 100 mL of bloody fluid removed. Triamcinolone 10 mg injected into joint space.   (including critical care time)  Medications Ordered in ED Medications  lidocaine-EPINEPHrine (XYLOCAINE W/EPI) 2 %-1:200000 (PF) injection 10 mL (10 mLs Infiltration Given 05/05/16 1202)  triamcinolone acetonide (KENALOG-40) injection 10 mg (10 mg Intramuscular Given 05/05/16 1202)  lidocaine (XYLOCAINE) 2 % (with pres) injection 400 mg (400 mg Other Given 05/05/16 1319)     Initial Impression / Assessment and Plan / ED Course  I have reviewed the triage vital signs and the nursing notes.  Pertinent labs & imaging results that were available during my care of the patient were reviewed by me and considered in my medical decision making (see chart for details).  Clinical Course   Successful arthrocentesis performed on left knee joint.  However, patient is unable to ambulate and daughter does not want to take him home. There is no social work available this  weekend. Discussed with hospitalist.  Final Clinical Impressions(s) / ED Diagnoses   Final diagnoses:  Acute pain of left knee    New Prescriptions New Prescriptions   No medications on file     Nat Christen, MD 05/05/16 1643

## 2016-05-06 DIAGNOSIS — M25462 Effusion, left knee: Secondary | ICD-10-CM | POA: Diagnosis not present

## 2016-05-06 DIAGNOSIS — G301 Alzheimer's disease with late onset: Secondary | ICD-10-CM

## 2016-05-06 DIAGNOSIS — I10 Essential (primary) hypertension: Secondary | ICD-10-CM

## 2016-05-06 DIAGNOSIS — F0280 Dementia in other diseases classified elsewhere without behavioral disturbance: Secondary | ICD-10-CM

## 2016-05-06 LAB — COMPREHENSIVE METABOLIC PANEL
ALBUMIN: 3.4 g/dL — AB (ref 3.5–5.0)
ALT: 21 U/L (ref 17–63)
ANION GAP: 6 (ref 5–15)
AST: 22 U/L (ref 15–41)
Alkaline Phosphatase: 85 U/L (ref 38–126)
BUN: 25 mg/dL — AB (ref 6–20)
CHLORIDE: 108 mmol/L (ref 101–111)
CO2: 26 mmol/L (ref 22–32)
Calcium: 8.4 mg/dL — ABNORMAL LOW (ref 8.9–10.3)
Creatinine, Ser: 1.34 mg/dL — ABNORMAL HIGH (ref 0.61–1.24)
GFR calc Af Amer: 52 mL/min — ABNORMAL LOW (ref >60)
GFR calc non Af Amer: 45 mL/min — ABNORMAL LOW (ref >60)
GLUCOSE: 147 mg/dL — AB (ref 65–99)
POTASSIUM: 4.7 mmol/L (ref 3.5–5.1)
SODIUM: 140 mmol/L (ref 135–145)
TOTAL PROTEIN: 6.4 g/dL — AB (ref 6.5–8.1)
Total Bilirubin: 0.8 mg/dL (ref 0.3–1.2)

## 2016-05-06 MED ORDER — ACETAMINOPHEN 325 MG PO TABS: 650.0000 mg | ORAL_TABLET | Freq: Four times a day (QID) | ORAL | Status: DC | PRN

## 2016-05-06 NOTE — Evaluation (Signed)
Physical Therapy Evaluation Patient Details Name: Billy Banks MRN: [id number] DOB: May 14, 1923 Today's Date: 05/06/2016   History of Present Illness  82 yo male admitted with L knee joint effusion. Hx of Alz, HTN, falls, macular degeneration, osteoporosis  Clinical Impression  On eval, pt required Max assist for mobility. He was able to stand x 1 for ~20 seconds before having to sit back down due to pain, anxiety/shaking. Attempted standing a 2nd time but pt was unable. Daughter was present during the session. She states she is only able to provide minimal assistance and that pt needs to be ambulatory. At this time, recommendation is for SNF.     Follow Up Recommendations SNF (daughter cannot manage pt's care at current level)    Equipment Recommendations  Wheelchair (lightweight);Rolling walker with 5" wheels    Recommendations for Other Services       Precautions / Restrictions Precautions Precautions: Fall Restrictions Weight Bearing Restrictions: No      Mobility  Bed Mobility Overal bed mobility: Needs Assistance Bed Mobility: Supine to Sit;Sit to Supine     Supine to sit: Min assist;HOB elevated Sit to supine: Min assist;HOB elevated   General bed mobility comments: Increased time. Multimodal cues. Assist for L LE. Pt relied heavily on bedrail  Transfers Overall transfer level: Needs assistance Equipment used: Rolling walker (2 wheeled) Transfers: Sit to/from Stand Sit to Stand: From elevated surface;Max assist         General transfer comment: x 2. On first attempt, pt was able to stand for ~20 seconds before becoming very shaky and sitting abruptly. On 2nd attempt, pt was unable to stand fully upright.   Ambulation/Gait             General Gait Details: Pt unable at this time  Stairs            Wheelchair Mobility    Modified Rankin (Stroke Patients Only)       Balance Overall balance assessment: Needs assistance   Sitting  balance-Leahy Scale: Good       Standing balance-Leahy Scale: Poor                               Pertinent Vitals/Pain Pain Assessment: Faces Faces Pain Scale: Hurts whole lot Pain Location: L knee with activity Pain Intervention(s): Limited activity within patient's tolerance;Repositioned    Home Living Family/patient expects to be discharged to:: Unsure Living Arrangements: Children Available Help at Discharge: Family Type of Home: Mobile home Home Access: Ramped entrance     Home Layout: One level Home Equipment: Wheelchair - [employer] - 4 wheels;Adaptive equipment;Tub bench (gait belt; bed alarm) Additional Comments: goes to senior center; staff at senior center assists with showers 2x/week    Prior Function Level of Independence: Independent with assistive device(s)               Hand Dominance        Extremity/Trunk Assessment   Upper Extremity Assessment: Defer to OT evaluation           Lower Extremity Assessment: Generalized weakness;LLE deficits/detail   LLE Deficits / Details: swelling noted. Limited knee flexion ~60 degrees  Cervical / Trunk Assessment: Kyphotic  Communication   Communication: No difficulties  Cognition Arousal/Alertness: Awake/alert Behavior During Therapy: Anxious Overall Cognitive Status: History of cognitive impairments - at baseline  General Comments      Exercises     Assessment/Plan    PT Assessment Patient needs continued PT services  PT Problem List Decreased strength;Decreased mobility;Decreased range of motion;Decreased activity tolerance;Decreased balance;Decreased cognition;Pain;Decreased knowledge of use of DME          PT Treatment Interventions DME instruction;Therapeutic activities;Gait training;Therapeutic exercise;Patient/family education;Functional mobility training;Balance training    PT Goals (Current goals can be found in the Care Plan section)   Acute Rehab PT Goals Patient Stated Goal: to be able to walk and return home PT Goal Formulation: With patient/family Time For Goal Achievement: 05/20/16 Potential to Achieve Goals: Good    Frequency Min 3X/week   Barriers to discharge        Co-evaluation               End of Session Equipment Utilized During Treatment: Gait belt Activity Tolerance: Patient limited by pain (Limited by anxiety) Patient left: in bed;with call bell/phone within reach;with family/visitor present      Functional Assessment Tool Used: clinical judgement Functional Limitation: Mobility: Walking and moving around Mobility: Walking and Moving Around Current Status [id number]): At least 40 percent but less than 60 percent impaired, limited or restricted Mobility: Walking and Moving Around Goal Status [phone]): At least 20 percent but less than 40 percent impaired, limited or restricted    Time: [id number] PT Time Calculation (min) (ACUTE ONLY): 26 min   Charges:   PT Evaluation $PT Eval Low Complexity: 1 Procedure PT Treatments $Therapeutic Activity: 8-22 mins   PT G Codes:   PT G-Codes **NOT FOR INPATIENT CLASS** Functional Assessment Tool Used: clinical judgement Functional Limitation: Mobility: Walking and moving around Mobility: Walking and Moving Around Current Status [id number]): At least 40 percent but less than 60 percent impaired, limited or restricted Mobility: Walking and Moving Around Goal Status [phone]): At least 20 percent but less than 40 percent impaired, limited or restricted    Weston Anna, MPT Pager: [phone]

## 2016-05-06 NOTE — Progress Notes (Signed)
OT Cancellation Note  Patient Details Name: Maysin Moehring MRN: [id number] DOB: 1923-03-17   Cancelled Treatment:    Reason Eval/Treat Not Completed: Other (comment)' Noted plan is SNF - will defer OT eval to SNF [facility], [place]  Betsy Pries 05/06/2016, 11:53 AM

## 2016-05-06 NOTE — Care Management Obs Status (Addendum)
[facility] NOTIFICATION   Patient Details  Name: Billy Banks MRN: [id number] Date of Birth: 09/27/1922   Medicare Observation Status Notification Given:  Yes    Erenest Rasher, RN 05/06/2016, 6:44 PM

## 2016-05-06 NOTE — Progress Notes (Signed)
Discharged from floor via w/c for transport home by car. Belongings & daughter with pt. No changes in assessment. Billy Banks  

## 2016-05-06 NOTE — Clinical Social Work Note (Signed)
Clinical Social Work Assessment  Patient Details  Name: Billy Banks MRN: [id number] Date of Birth: Apr 19, 1923  Date of referral:  05/06/16               Reason for consult:  Facility Placement, Discharge Planning                Permission sought to share information with:  Family Supports Permission granted to share information::  Yes, Verbal Permission Granted  Name::     Earley Abide  Relationship::  daughter  Contact Information:  [phone]  Housing/Transportation Living arrangements for the past 2 months:  [place] of Information:  Patient, Adult Children Patient Interpreter Needed:  None Criminal Activity/Legal Involvement Pertinent to Current Situation/Hospitalization:  No - Comment as needed Significant Relationships:  Adult Children Lives with:  Adult Children Do you feel safe going back to the place where you live?  Yes Need for family participation in patient care:  No (Coment)  Care giving concerns:  Pt has a history of falls.   Social Worker assessment / plan:  CSW met with pt and daughter to address consult for New SNF. PT is recommending SNF, however pt is MEDICARE OBSERVATION. CSW introduced herself and explained role of social work. CSW also explained the process of discharging to SNF with Medicare Obs. Pt would have to pay privately. CSW shared that a SNF search could be done and a request of private payment quotes. Pt's daughter shared that she would not be able to afford the private payments. CSW shared that arrangements for home health care can made, which would be covered by Medicare. CSW recommends a home health social worker for assistance.   RNCM also met with pt and daughter to address OBS status. Pt will discharge home tonight with home health services, which RNCM is following for.   CSW is signing off as no further needs identified.   Employment status:  Retired Forensic scientist:  Medicare ([place]) PT  Recommendations:  [place] / Referral to community resources:  [place]  Patient/Family's Response to care:  Pt's daughter was very upset about OBS status.   Patient/Family's Understanding of and Emotional Response to Diagnosis, Current Treatment, and Prognosis:  Pt's daughter was agreeable to home health services as a way to build strength to return to [facility] day program.   Emotional Assessment Appearance:  Appears stated age Attitude/Demeanor/Rapport:   (Appropriate) Affect (typically observed):  Accepting, Adaptable, Pleasant Orientation:  Oriented to Self, Oriented to Place Alcohol / Substance use:  Never Used Psych involvement (Current and /or in the community):  No (Comment)  Discharge Needs  Concerns to be addressed:  Other (Comment Required (Home Health needs) Readmission within the last 30 days:  No Current discharge risk:  Other (history of falls) Barriers to Discharge:  No Barriers Identified, Other (Medicare OBS)   Darden Dates, LCSW 05/06/2016, 6:23 PM

## 2016-05-06 NOTE — Discharge Summary (Signed)
Physician Discharge Summary  Billy Banks [id number] DOB: [birth year] DOA: 05/05/2016  PCP: [place] date: 05/05/2016 Discharge date: 05/06/2016  Time spent: > 35 minutes  Recommendations for Outpatient Follow-up:  1. Please assist in placing to facility due to debility. Daughter preferred to take patient home on day of d/c   Discharge Diagnoses:  Active Problems:   Late onset Alzheimer's disease without behavioral disturbance   Essential hypertension   Effusion of left knee   Discharge Condition: stable  Diet recommendation: heart healthy  Filed Weights   05/05/16 1820  Weight: 83.5 kg (184 lb)    History of present illness:  82 y/o presenting with left knee effusion  Hospital Course:  Left knee effusion - arthrocentesis done by eD physician - pain control with tylenol and NSAID - f/u with pcp - fill out home health orders to assist patient should he require placement  Procedures:  As listed above  Consultations:  none  Discharge Exam: Vitals:   05/06/16 1219 05/06/16 1322  BP: (!) 166/95 136/62  Pulse:  77  Resp:  17  Temp:  98.2 F (36.8 C)    General: pt in nad, alert and awake Cardiovascular: rrr, no rubs Respiratory: CTA BL, no increased wob  Discharge Instructions   Discharge Instructions    Diet - low sodium heart healthy    Complete by:  As directed    Increase activity slowly    Complete by:  As directed      Current Discharge Medication List    CONTINUE these medications which have CHANGED   Details  acetaminophen (TYLENOL) 325 MG tablet Take 2 tablets (650 mg total) by mouth every 6 (six) hours as needed for mild pain (or Fever >/= 101).      CONTINUE these medications which have NOT CHANGED   Details  amLODipine (NORVASC) 5 MG tablet Take 5 mg by mouth daily.    aspirin 81 MG tablet Take 81 mg by mouth daily.    fluticasone (FLONASE) 50 MCG/ACT nasal spray Place 1 spray into both nostrils  daily as needed for allergies or rhinitis.    galantamine (RAZADYNE) 12 MG tablet Take 1 tablet (12 mg total) by mouth 2 (two) times daily. Qty: 180 tablet, Refills: 1    levothyroxine (SYNTHROID, LEVOTHROID) 88 MCG tablet Take 88 mcg by mouth daily before breakfast.    Multiple Vitamins-Minerals (PRESERVISION AREDS 2 PO) Take 1 tablet by mouth 2 (two) times daily.    NAMENDA XR 28 MG CP24 24 hr capsule TAKE 1 CAPSULE (28 MG TOTAL) BY MOUTH DAILY. Qty: 30 capsule, Refills: 3    NAPROXEN EX Apply 1 application topically daily as needed (arthritis pain).    Omega-3 Fatty Acids (FISH OIL PO) Take 1,000 mg by mouth 2 (two) times daily.        No Known Allergies    The results of significant diagnostics from this hospitalization (including imaging, microbiology, ancillary and laboratory) are listed below for reference.    Significant Diagnostic Studies: Dg Knee Complete 4 Views Left  Result Date: 05/05/2016 CLINICAL DATA:  Pain in left knee without trauma. Knee surgery 10 years ago. EXAM: LEFT KNEE - COMPLETE 4+ VIEW COMPARISON:  None. FINDINGS: The knee replacement hardware is in good position with no evidence of failure or loosening. No fractures. Vascular calcifications are noted. A moderate to large joint effusion is identified. IMPRESSION: Moderate to large joint effusion. Knee replacement. No other acute abnormalities. Electronically Signed  By: Dorise Bullion III M.D   On: 05/05/2016 11:11    Microbiology: Recent Results (from the past 240 hour(s))  Culture, body fluid-bottle     Status: None (Preliminary result)   Collection Time: 05/05/16  4:23 PM  Result Value Ref Range Status   Specimen Description KNEE  Final   Special Requests NONE  Final   Culture   Final    NO GROWTH < 24 HOURS Performed at [facility]    Report Status PENDING  Incomplete  Gram stain     Status: None   Collection Time: 05/05/16  4:23 PM  Result Value Ref Range Status   Specimen  Description KNEE  Final   Special Requests NONE  Final   Gram Stain   Final    ABUNDANT WBC PRESENT,BOTH PMN AND MONONUCLEAR NO ORGANISMS SEEN Performed at [facility]    Report Status 05/05/2016 FINAL  Final     Labs: Basic Metabolic Panel:  Recent Labs Lab 05/05/16 1345 05/06/16 0405  NA 139 140  K 4.3 4.7  CL 109 108  CO2 24 26  GLUCOSE 107* 147*  BUN 26* 25*  CREATININE 1.27* 1.34*  CALCIUM 8.1* 8.4*   Liver Function Tests:  Recent Labs Lab 05/06/16 0405  AST 22  ALT 21  ALKPHOS 85  BILITOT 0.8  PROT 6.4*  ALBUMIN 3.4*   No results for input(s): LIPASE, AMYLASE in the last 168 hours. No results for input(s): AMMONIA in the last 168 hours. CBC:  Recent Labs Lab 05/05/16 1345  WBC 15.6*  NEUTROABS 11.5*  HGB 14.1  HCT 47.7  MCV 71.0*  PLT 780*   Cardiac Enzymes: No results for input(s): CKTOTAL, CKMB, CKMBINDEX, TROPONINI in the last 168 hours. BNP: BNP (last 3 results) No results for input(s): BNP in the last 8760 hours.  ProBNP (last 3 results) No results for input(s): PROBNP in the last 8760 hours.  CBG: No results for input(s): GLUCAP in the last 168 hours.     Signed:  Velvet Bathe MD.  [facility] 05/06/2016, 6:41 PM

## 2016-05-06 NOTE — Progress Notes (Addendum)
NCM spoke to pt and dtr at bedside. Dtr states she wanted pt to dc home with [place]. Offered choice for [facility]. Dtr requested [facility] for [facility]. Explained NCM will follow up to see if they could do [place]. Second choice is [facility] for [facility]. Pt has [employer] at home. Dtr requesting note for return back [facility] on Wednesday. NCM spoke with attending and provide dtr with note. Faxed dc summary to [facility].  Jonnie Finner RN CCM Case Mgmt phone [phone]

## 2016-05-06 NOTE — Progress Notes (Signed)
PROGRESS NOTE    Billy Banks  [id number] DOB: Jun 20, 1923 DOA: 05/05/2016 PCP: [facility]    Brief Narrative:  Billy Banks  is a 82 y.o. male, with a past medical history significant for dementia, hypothyroidism , Alzheimer's dementia and HTN , who presents to ED with complaints of left knee pain, patient lives with his daughter at home, usually ambulates with a walker, occasionally with wheelchair, reports he woke up from sleep this morning, complaining of left knee pain, unable to stand up, thickened due to pain, EMS brought him to ED, workup was significant for left knee effusion on x-ray, agent is afebrile, denies any fever or chills, as per daughter does not have any altered mental status or loss of appetite, appears at baseline, patient status post left total knee replacement 10 years ago, no complications since. - IN ED patient had arthrocentesis by ED physician with 100 mL of bloody fluid drained, as well he had steroid injection, patient still complaining of pain, unable to stand up secondary to pain and unsteady gait, hospitalist requested to admit for further management   Assessment & Plan:    Effusion of left knee - multiple x rays obtained of knee. Reported moderate to large joing effusion. No other acute abnormalities. - no organisms seen on gram stain. Will hold off on antibiotics given no fever and negative gram stain  Active Problems:   Late onset Alzheimer's disease without behavioral disturbance - stable continue home medication regimen    Essential hypertension - norvasc on board.  Leukocytosis  - Most likely secondary to stress mediated reaction. No source of infection identified.  DVT prophylaxis: Heparin Code Status: DNR Family Communication: with daughter Disposition Plan: Awaiting physical therapy evaluation   Consultants:   None   Procedures: Arthrocentesis while in the ED   Antimicrobials: None   Subjective: Pt has no new  complaints. No acute issues overnight.  Objective: Vitals:   05/06/16 0820 05/06/16 1050 05/06/16 1219 05/06/16 1322  BP: (!) 173/76 (!) 153/74 (!) 166/95 136/62  Pulse: 85 70  77  Resp: 15   17  Temp: 98 F (36.7 C)   98.2 F (36.8 C)  TempSrc: Oral   Oral  SpO2: 94%   94%  Weight:      Height:        Intake/Output Summary (Last 24 hours) at 05/06/16 1541 Last data filed at 05/06/16 1323  Gross per 24 hour  Intake              720 ml  Output              875 ml  Net             -155 ml   Filed Weights   05/05/16 1820  Weight: 83.5 kg (184 lb)    Examination:  General exam: Appears calm and comfortable, in nad. Respiratory system: Clear to auscultation. Respiratory effort normal. Cardiovascular system: S1 & S2 heard, RRR. No JVD, murmurs, rubs, gallops or clicks. No pedal edema. Gastrointestinal system: Abdomen is nondistended, soft and nontender. No organomegaly or masses felt. Normal bowel sounds heard. Central nervous system: Alert and awake. No focal neurological deficits. Extremities: equal tone, left knee edema Skin: No lesions or ulcers Psychiatry: Judgement and insight appear normal. Mood & affect appropriate.     Data Reviewed: I have personally reviewed following labs and imaging studies  CBC:  Recent Labs Lab 05/05/16 1345  WBC 15.6*  NEUTROABS 11.5*  HGB 14.1  HCT 47.7  MCV 71.0*  PLT 123456*   Basic Metabolic Panel:  Recent Labs Lab 05/05/16 1345 05/06/16 0405  NA 139 140  K 4.3 4.7  CL 109 108  CO2 24 26  GLUCOSE 107* 147*  BUN 26* 25*  CREATININE 1.27* 1.34*  CALCIUM 8.1* 8.4*   GFR: Estimated Creatinine Clearance: 37.1 mL/min (by C-G formula based on SCr of 1.34 mg/dL (H)). Liver Function Tests:  Recent Labs Lab 05/06/16 0405  AST 22  ALT 21  ALKPHOS 85  BILITOT 0.8  PROT 6.4*  ALBUMIN 3.4*   No results for input(s): LIPASE, AMYLASE in the last 168 hours. No results for input(s): AMMONIA in the last 168  hours. Coagulation Profile: No results for input(s): INR, PROTIME in the last 168 hours. Cardiac Enzymes: No results for input(s): CKTOTAL, CKMB, CKMBINDEX, TROPONINI in the last 168 hours. BNP (last 3 results) No results for input(s): PROBNP in the last 8760 hours. HbA1C: No results for input(s): HGBA1C in the last 72 hours. CBG: No results for input(s): GLUCAP in the last 168 hours. Lipid Profile: No results for input(s): CHOL, HDL, LDLCALC, TRIG, CHOLHDL, LDLDIRECT in the last 72 hours. Thyroid Function Tests: No results for input(s): TSH, T4TOTAL, FREET4, T3FREE, THYROIDAB in the last 72 hours. Anemia Panel: No results for input(s): VITAMINB12, FOLATE, FERRITIN, TIBC, IRON, RETICCTPCT in the last 72 hours. Sepsis Labs: No results for input(s): PROCALCITON, LATICACIDVEN in the last 168 hours.  Recent Results (from the past 240 hour(s))  Gram stain     Status: None   Collection Time: 05/05/16  4:23 PM  Result Value Ref Range Status   Specimen Description KNEE  Final   Special Requests NONE  Final   Gram Stain   Final    ABUNDANT WBC PRESENT,BOTH PMN AND MONONUCLEAR NO ORGANISMS SEEN Performed at [facility]    Report Status 05/05/2016 FINAL  Final         Radiology Studies: Dg Knee Complete 4 Views Left  Result Date: 05/05/2016 CLINICAL DATA:  Pain in left knee without trauma. Knee surgery 10 years ago. EXAM: LEFT KNEE - COMPLETE 4+ VIEW COMPARISON:  None. FINDINGS: The knee replacement hardware is in good position with no evidence of failure or loosening. No fractures. Vascular calcifications are noted. A moderate to large joint effusion is identified. IMPRESSION: Moderate to large joint effusion. Knee replacement. No other acute abnormalities. Electronically Signed   By: Dorise Bullion III M.D   On: 05/05/2016 11:11        Scheduled Meds: . amLODipine  5 mg Oral Daily  . aspirin  81 mg Oral Daily  . galantamine  12 mg Oral BID  . heparin  5,000 Units  Subcutaneous Q8H  . levothyroxine  88 mcg Oral QAC breakfast  . memantine  28 mg Oral Daily  . omega-3 acid ethyl esters  1 g Oral BID  . senna  1 tablet Oral BID   Continuous Infusions:    LOS: 0 days   Time spent: > 35 minutes  Velvet Bathe, MD [facility] Pager [phone]  If 7PM-7AM, please contact night-coverage [email or web address] Password [email or web address] 05/06/2016, 3:41 PM

## 2016-05-06 NOTE — Plan of Care (Signed)
Problem: Education: Goal: Knowledge of [facility] Education information/materials will improve Outcome: Completed/Met Date Met: 05/06/16 With daughter (POA)  Problem: Health Behavior/Discharge Planning: Goal: Ability to manage health-related needs will improve Outcome: Completed/Met Date Met: 05/06/16 With daughter (POA)

## 2016-05-06 NOTE — Care Management Note (Signed)
Case Management Note  Patient Details  Name: Billy Banks MRN: [id number] Date of Birth: 08-09-1922  Subjective/Objective:    pain                Action/Plan: Discharge Planning:  [facility] Needs # home [phone] preference*, cell [phone] is pt's contact. Lives with dtr. Goes to [facility] to Adult Day program. Waiting PT evaluation. SNF vs Home with HH. Will continue to follow for dc needs.    Expected Discharge Date:                  Expected Discharge Plan:  Skilled Nursing Facility  In-House Referral:  Clinical Social Work  Discharge planning Services  CM Consult  Post Acute Care Choice:  NA Choice offered to:  NA  DME Arranged:  N/A DME Agency:  NA  HH Arranged:  NA HH Agency:  NA  Status of Service:  In process, will continue to follow  If discussed at Long Length of Stay Meetings, dates discussed:    Additional Comments:  Erenest Rasher, RN 05/06/2016, 10:21 AM

## 2016-05-10 LAB — CULTURE, BODY FLUID-BOTTLE: CULTURE: NO GROWTH

## 2016-05-10 LAB — CULTURE, BODY FLUID W GRAM STAIN -BOTTLE

## 2016-06-16 ENCOUNTER — Other Ambulatory Visit: Payer: Self-pay | Admitting: Neurology

## 2016-08-06 ENCOUNTER — Encounter: Payer: Self-pay | Admitting: Neurology

## 2016-08-06 ENCOUNTER — Ambulatory Visit (INDEPENDENT_AMBULATORY_CARE_PROVIDER_SITE_OTHER): Payer: Medicare Other | Admitting: Neurology

## 2016-08-06 VITALS — BP 142/84 | HR 72 | Ht 67.0 in | Wt 168.8 lb

## 2016-08-06 DIAGNOSIS — F0280 Dementia in other diseases classified elsewhere without behavioral disturbance: Secondary | ICD-10-CM

## 2016-08-06 DIAGNOSIS — G301 Alzheimer's disease with late onset: Secondary | ICD-10-CM | POA: Diagnosis not present

## 2016-08-06 MED ORDER — MEMANTINE HCL ER 28 MG PO CP24: 28.0000 mg | ORAL_CAPSULE | Freq: Every day | ORAL | 6 refills | Status: DC

## 2016-08-06 NOTE — Patient Instructions (Addendum)
1.  Continue galantamine and Namenda (refilled Namenda) 2.  Continue going to [facility] 3.  Follow up in 6 months.

## 2016-08-06 NOTE — Progress Notes (Signed)
NEUROLOGY FOLLOW UP OFFICE NOTE  Billy Banks [id number]  HISTORY OF PRESENT ILLNESS: Billy Banks is a 83 year old right-handed man with hypertension, hypothyroidism, dementia, lumbar stenosis and history of prostate cancer who follows up for dementia and falls.  He is accompanied by his daughter who supplements history.   UPDATE: He is currently taking galantamine 12mg  twice daily and Namenda XR 28mg  daily.  His daughter reports that he has accidentally been ingesting his denture tablets thinking they were medication, so she had to remove them from the bathroom.  [facility] 5 days a week where he exercises and socializes.  He enjoys it.  He has dressing apraxia as he often puts his shoes on the wrong foot and has difficulty with buttons.  Sometimes, he makes paraphasic errors, such as saying "months" when he meant "hours".  He is not delirious or combative.  He does not hallucinate.   HISTORY: For the last 2.5 years, he has had a gradual progression of recurrent falls.  He denies lightheadedness, vertigo, slurred speech, bowel or bladder incontinence, headache or visual disturbance.  When he is walking, he sometimes suddenly feels shaky and his legs give out.  He often falls to the left side.  It occurs sporadically.  He has chronic back pain but no pain down the legs.  He denies numbness in the feet.   CT of the head from 08/08/14 showed mild to moderate chronic small vessel disease without ventriculomegaly.    CT of cervical spine showed severe degenerative disc disease from C3-4 through C6-7 levels.  Most recently, he was in the hospital in February for recurrent falls.  Heart rate reportedly fluctuated.  EKG showed NSR with first degree heart block.  Ultimately, it was not found to be due to a cardiac etiology, orthostatic hypotension or hypoglycemia.  MRI of the lumbar spine performed in February showed multilevel spinal stenosis at L2-3 and L3-4.      He has had falls where he was on the ground for up to 3 hours because there was nobody around.  He uses a walker but will forget to sit down in the seat when he begins to feel shaky.  He has since moved in with his daughter and he has not had falls.  For 2-3 hours, he may be home alone.  However, during this time, he remains in his small room where the toilet is just 4 or 5 steps away.     He lives with his daughter.  He began to be forgetful a few years ago.  Doctors reportedly told him that he was having mini-strokes.  It appears to be a gradual decline.  He has word-finding difficulties.  When he has trouble communicating with others, he gets irritated and angry.  His daughter has to administer his medications.  He is resistant at times to bathe.  He uses the toilet by himself.  He sometimes has mild urinary incontinence.  He sleeps often during the day.  Sometimes when he wakes up in the middle of the night, he thinks it is daylight.  He forgets names of people.  He has mixed up his daughters.  He exhibits signs of executive dysfunction and dressing apraxia as well.  When he goes to appointments with his daughter, he is not sure where he is.  He had reduced appetite.  He was adopted, so family history of dementia is unknown.   He was previously  on Aricept, which was discontinued due to diarrhea.  PAST MEDICAL HISTORY: Past Medical History:  Diagnosis Date  . Adult hypothyroidism 06/24/2015  . Alzheimer disease   . Bright red blood per rectum 06/24/2015  . Cancer [facility])    prostate  . Essential hypertension 05/02/2015  . Excessive falling 08/08/2014  . GERD (gastroesophageal reflux disease)   . H/O malignant neoplasm of prostate 06/24/2015  . History of small bowel obstruction 11/17/2013  . History of small bowel obstruction 11/17/2013  . HLD (hyperlipidemia) 06/24/2015  . Hyperlipidemia   . Hypertension   . Late onset Alzheimer's disease without behavioral disturbance 05/02/2015  . Left  anterior fascicular block 06/24/2015  . Macular degeneration   . Osteoporosis 06/24/2015  . Spinal stenosis of lumbar region   . Thyroid disease   . Weakness 06/23/2015    MEDICATIONS: Current Outpatient Prescriptions on File Prior to Visit  Medication Sig Dispense Refill  . amLODipine (NORVASC) 5 MG tablet Take 5 mg by mouth daily.    Marland Kitchen aspirin 81 MG tablet Take 81 mg by mouth daily.    . fluticasone (FLONASE) 50 MCG/ACT nasal spray Place 1 spray into both nostrils daily as needed for allergies or rhinitis.    Marland Kitchen galantamine (RAZADYNE) 12 MG tablet TAKE 1 TABLET BY MOUTH TWICE A DAY 180 tablet 1  . levothyroxine (SYNTHROID, LEVOTHROID) 88 MCG tablet Take 88 mcg by mouth daily before breakfast.    . Multiple Vitamins-Minerals (PRESERVISION AREDS 2 PO) Take 1 tablet by mouth 2 (two) times daily.    Marland Kitchen NAPROXEN EX Apply 1 application topically daily as needed (arthritis pain).    . Omega-3 Fatty Acids (FISH OIL PO) Take 1,000 mg by mouth 2 (two) times daily.     Marland Kitchen acetaminophen (TYLENOL) 325 MG tablet Take 2 tablets (650 mg total) by mouth every 6 (six) hours as needed for mild pain (or Fever >/= 101). (Patient not taking: Reported on 08/06/2016)     No current facility-administered medications on file prior to visit.     ALLERGIES: No Known Allergies  FAMILY HISTORY: Family History  Problem Relation Age of Onset  . Adopted: Yes    SOCIAL HISTORY: Social History   Social History  . Marital status: Widowed    Spouse name: N/A  . Number of children: N/A  . Years of education: N/A   Occupational History  . Not on file.   Social History Main Topics  . Smoking status: Former Research scientist (life sciences)  . Smokeless tobacco: Never Used  . Alcohol use No  . Drug use: No  . Sexual activity: No   Other Topics Concern  . Not on file   Social History Narrative   ** Merged History Encounter **        REVIEW OF SYSTEMS: Constitutional: No fevers, chills, or sweats, no generalized fatigue, change  in appetite Eyes: No visual changes, double vision, eye pain Ear, nose and throat: No hearing loss, ear pain, nasal congestion, sore throat Cardiovascular: No chest pain, palpitations Respiratory:  No shortness of breath at rest or with exertion, wheezes GastrointestinaI: No nausea, vomiting, diarrhea, abdominal pain, fecal incontinence Genitourinary:  No dysuria, urinary retention or frequency Musculoskeletal:  No neck pain, back pain Integumentary: No rash, pruritus, skin lesions Neurological: as above Psychiatric: No depression, insomnia, anxiety Endocrine: No palpitations, fatigue, diaphoresis, mood swings, change in appetite, change in weight, increased thirst Hematologic/Lymphatic:  No purpura, petechiae. Allergic/Immunologic: no itchy/runny eyes, nasal congestion, recent allergic reactions, rashes  PHYSICAL  EXAM: Vitals:   08/06/16 1434  BP: (!) 142/84  Pulse: 72   General: No acute distress.  Patient appears well-groomed.  normal body habitus. Head:  Normocephalic/atraumatic Eyes:  Fundi examined but not visualized Neck: supple, no paraspinal tenderness, full range of motion Heart:  Regular rate and rhythm Lungs:  Clear to auscultation bilaterally Back: No paraspinal tenderness Neurological Exam: alert and oriented to person, state (not city), and month (not year). Attention span and concentration impaired, recent memory poor, remote memory intact, fund of knowledge intact.  Speech fluent and not dysarthric, language intact.  CN II-XII intact. Bulk and tone normal, muscle strength 5/5 throughout.  Sensation to light touch  intact.  Deep tendon reflexes 2+ throughout.  Finger to nose testing intact.  Wheelchair.  IMPRESSION: Alzheimer's dementia  PLAN: 1.  Continue galantamine and Namenda 2.  Continue going to adult day center for physical activity and socialization. 3.  Supervision at home. 4.  Follow up in 6 months.  27 minutes spent face to face with patient, over 50%  spent discussing prognosis and management.  Metta Clines, DO  CC:  [facility]

## 2016-08-23 DIAGNOSIS — Z029 Encounter for administrative examinations, unspecified: Secondary | ICD-10-CM

## 2016-12-04 ENCOUNTER — Telehealth: Payer: Self-pay | Admitting: Neurology

## 2016-12-04 NOTE — Telephone Encounter (Signed)
Caller: PT' daughter  Urgent? No  Reason for the call: Has a question regarding his medication of Namenda

## 2016-12-06 NOTE — Telephone Encounter (Signed)
Left message on machine for patient's daughter to call back.   

## 2016-12-19 ENCOUNTER — Telehealth: Payer: Self-pay | Admitting: Neurology

## 2016-12-19 NOTE — Telephone Encounter (Signed)
Patient going to respite care June 10-24. Pharmacy told patient's daughter they could not pick up RX for Namenda before June 15th. I called pharmacy and had them override to pick up early for one time. They will have it ready and insurance will cover on June 5th. Daughter made aware.

## 2016-12-19 NOTE — Telephone Encounter (Signed)
Left message on machine for patient to call back.

## 2016-12-19 NOTE — Telephone Encounter (Signed)
PT called and said insurance will not cover his medication if called in early and he is going into respite care and needs to figure out how to get it filled so he does not run out while in care, the medication is [employer]

## 2016-12-19 NOTE — Telephone Encounter (Signed)
Caller: Daughter Benjamine Mola  Urgent? No   Reason for the call: She missed a call from the office. She lmom regarding his Nomenda medication. Please call. Thanks

## 2016-12-19 NOTE — Telephone Encounter (Signed)
Already spoke with daughter directly about this.

## 2017-01-28 ENCOUNTER — Ambulatory Visit (INDEPENDENT_AMBULATORY_CARE_PROVIDER_SITE_OTHER): Payer: Medicare Other | Admitting: Neurology

## 2017-01-28 ENCOUNTER — Encounter: Payer: Self-pay | Admitting: Neurology

## 2017-01-28 VITALS — BP 144/80 | HR 92 | Ht 67.0 in | Wt 185.1 lb

## 2017-01-28 DIAGNOSIS — I10 Essential (primary) hypertension: Secondary | ICD-10-CM

## 2017-01-28 DIAGNOSIS — G301 Alzheimer's disease with late onset: Secondary | ICD-10-CM

## 2017-01-28 DIAGNOSIS — F0280 Dementia in other diseases classified elsewhere without behavioral disturbance: Secondary | ICD-10-CM | POA: Diagnosis not present

## 2017-01-28 NOTE — Patient Instructions (Signed)
1.  Continue galantamine and memantine 2.  Monitor for sundowning.  We can start an anti-psychotic medication but I would like to avoid if possible.  Try to reassure him. 3.  Follow up in 6 months.

## 2017-01-28 NOTE — Progress Notes (Signed)
NEUROLOGY FOLLOW UP OFFICE NOTE  Billy Banks [id number]  HISTORY OF PRESENT ILLNESS: Billy Banks is a 83 year old right-handed man with hypertension, hypothyroidism, dementia, lumbar stenosis and history of prostate cancer who follows up for dementia and falls.  He is accompanied by his daughter who supplements history.   UPDATE: He is currently taking galantamine 12mg  twice daily and Namenda XR 28mg  daily.  He has had increased word-finding difficulty and sometimes makes paraphasic errors.  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  He has had some sundowning where he gets a little agitated.  One time he couldn't find his bible.  Another time, he was asking about money and his finances.  On one occasion, he threw a book at his daughter but that was an isolated incident.  He is not hallucinating.  He also keeps picking at his scabs.  [facility] 5 days a week where he exercises and socializes.   HISTORY: For the last 3 years, he has had a gradual progression of recurrent falls.  He denies lightheadedness, vertigo, slurred speech, bowel or bladder incontinence, headache or visual disturbance.  When he is walking, he sometimes suddenly feels shaky and his legs give out.  He often falls to the left side.  It occurs sporadically.  He has chronic back pain but no pain down the legs.  He denies numbness in the feet.   CT of the head from 08/08/14 showed mild to moderate chronic small vessel disease without ventriculomegaly.    CT of cervical spine showed severe degenerative disc disease from C3-4 through C6-7 levels.  Most recently, he was in the hospital in February for recurrent falls.  Heart rate reportedly fluctuated.  EKG showed NSR with first degree heart block.  Ultimately, it was not found to be due to a cardiac etiology, orthostatic hypotension or hypoglycemia.  MRI of the lumbar spine performed in  February showed multilevel spinal stenosis at L2-3 and L3-4.     He has had falls where he was on the ground for up to 3 hours because there was nobody around.  He uses a walker but will forget to sit down in the seat when he begins to feel shaky.  He has since moved in with his daughter and he has not had falls.  For 2-3 hours, he may be home alone.  However, during this time, he remains in his small room where the toilet is just 4 or 5 steps away.     He lives with his daughter.  He began to be forgetful a few years ago.  Doctors reportedly told him that he was having mini-strokes.  It appears to be a gradual decline.  He has word-finding difficulties.  When he has trouble communicating with others, he gets irritated and angry.  His daughter has to administer his medications.  He is resistant at times to bathe.  He uses the toilet by himself.  He sometimes has mild urinary incontinence.  He sleeps often during the day.  Sometimes when he wakes up in the middle of the night, he thinks it is daylight.  He forgets names of people.  He has mixed up his daughters.  He exhibits signs of executive dysfunction and dressing apraxia as well.  When he goes to appointments with his daughter, he is not sure where he is.  He had reduced appetite.  He was adopted, so  family history of dementia is unknown.   He was previously on Aricept, which was discontinued due to diarrhea.  PAST MEDICAL HISTORY: Past Medical History:  Diagnosis Date  . Adult hypothyroidism 06/24/2015  . Alzheimer disease   . Bright red blood per rectum 06/24/2015  . Cancer [facility])    prostate  . Essential hypertension 05/02/2015  . Excessive falling 08/08/2014  . GERD (gastroesophageal reflux disease)   . H/O malignant neoplasm of prostate 06/24/2015  . History of small bowel obstruction 11/17/2013  . History of small bowel obstruction 11/17/2013  . HLD (hyperlipidemia) 06/24/2015  . Hyperlipidemia   . Hypertension   . Late onset Alzheimer's  disease without behavioral disturbance 05/02/2015  . Left anterior fascicular block 06/24/2015  . Macular degeneration   . Osteoporosis 06/24/2015  . Spinal stenosis of lumbar region   . Thyroid disease   . Weakness 06/23/2015    MEDICATIONS: Current Outpatient Prescriptions on File Prior to Visit  Medication Sig Dispense Refill  . acetaminophen (TYLENOL) 325 MG tablet Take 2 tablets (650 mg total) by mouth every 6 (six) hours as needed for mild pain (or Fever >/= 101).    Marland Kitchen amLODipine (NORVASC) 5 MG tablet Take 5 mg by mouth daily.    Marland Kitchen aspirin 81 MG tablet Take 81 mg by mouth daily.    . fluticasone (FLONASE) 50 MCG/ACT nasal spray Place 1 spray into both nostrils daily as needed for allergies or rhinitis.    Marland Kitchen galantamine (RAZADYNE) 12 MG tablet TAKE 1 TABLET BY MOUTH TWICE A DAY 180 tablet 1  . levothyroxine (SYNTHROID, LEVOTHROID) 88 MCG tablet Take 88 mcg by mouth daily before breakfast.    . memantine (NAMENDA XR) 28 MG CP24 24 hr capsule Take 1 capsule (28 mg total) by mouth daily. 90 capsule 6  . Multiple Vitamins-Minerals (PRESERVISION AREDS 2 PO) Take 1 tablet by mouth 2 (two) times daily.    Marland Kitchen NAPROXEN EX Apply 1 application topically daily as needed (arthritis pain).    . Omega-3 Fatty Acids (FISH OIL PO) Take 1,000 mg by mouth 2 (two) times daily.      No current facility-administered medications on file prior to visit.     ALLERGIES: No Known Allergies  FAMILY HISTORY: Family History  Problem Relation Age of Onset  . Adopted: Yes    SOCIAL HISTORY: Social History   Social History  . Marital status: Widowed    Spouse name: N/A  . Number of children: N/A  . Years of education: N/A   Occupational History  . Not on file.   Social History Main Topics  . Smoking status: Former Research scientist (life sciences)  . Smokeless tobacco: Never Used  . Alcohol use No  . Drug use: No  . Sexual activity: No   Other Topics Concern  . Not on file   Social History Narrative   ** Merged  History Encounter **        REVIEW OF SYSTEMS: Constitutional: No fevers, chills, or sweats, no generalized fatigue, change in appetite Eyes: No visual changes, double vision, eye pain Ear, nose and throat: No hearing loss, ear pain, nasal congestion, sore throat Cardiovascular: No chest pain, palpitations Respiratory:  No shortness of breath at rest or with exertion, wheezes GastrointestinaI: No nausea, vomiting, diarrhea, abdominal pain, fecal incontinence Genitourinary:  No dysuria, urinary retention or frequency Musculoskeletal:  No neck pain, back pain Integumentary: No rash, pruritus, skin lesions Neurological: as above Psychiatric: No depression, insomnia, anxiety Endocrine: No palpitations, fatigue,  diaphoresis, mood swings, change in appetite, change in weight, increased thirst Hematologic/Lymphatic:  No purpura, petechiae. Allergic/Immunologic: no itchy/runny eyes, nasal congestion, recent allergic reactions, rashes  PHYSICAL EXAM: Vitals:   01/28/17 1420  BP: (!) 144/80  Pulse: 92   General: No acute distress.  Head:  Normocephalic/atraumatic Eyes:  Fundi examined but not visualized Neck: supple, no paraspinal tenderness, full range of motion Heart:  Regular rate and rhythm Lungs:  Clear to auscultation bilaterally Back: No paraspinal tenderness Neurological Exam: alert and oriented to person, state, month and season. Attention span and concentration impaired, recent memory poor, remote memory fair, fund of knowledge reduced.  Speech fluent and not dysarthric, language intact.   MMSE - Mini Mental State Exam 01/28/2017 05/02/2015 11/01/2014  Orientation to time 2 4 4   Orientation to Place 2 4 3   Registration 3 3 3   Attention/ Calculation 3 1 5   Recall 0 3 0  Language- name 2 objects 2 2 2   Language- repeat 1 1 0  Language- follow 3 step command 3 3 3   Language- read & follow direction 1 1 1   Write a sentence 1 1 1   Copy design 0 1 1  Total score 18 24 23    CN  II-XII intact. Bulk and tone normal, muscle strength 5/5 throughout.  Sensation to light touch, temperature and vibration intact.  Deep tendon reflexes 2+ throughout.  Finger to nose testing intact.  In wheelchair.  IMPRESSION: Alzheimer's dementia HTN, borderline elevated today.  PLAN: 1.  Continue galantamine 12mg  twice daily and Namenda XR 28mg  daily 2.  Monitor sundowning for now and try to reassure him.  If it continues to progress, we can consider antipsychotic medication such as seroquel.  However, given side effects (discussed black box warning of increased mortality with daughter), I would like to try and avoid medication. 3.  HTN.  Borderline elevated today.  Follow up with PCP. 4.  Follow up in 6 months.  25 minutes spent face to face with patient, over 50% spent discussing management.  Metta Clines, DO  CC: [facility]

## 2017-02-10 ENCOUNTER — Emergency Department (HOSPITAL_COMMUNITY): Payer: Medicare Other

## 2017-02-10 ENCOUNTER — Encounter (HOSPITAL_COMMUNITY): Payer: Self-pay

## 2017-02-10 ENCOUNTER — Inpatient Hospital Stay (HOSPITAL_COMMUNITY)
Admission: EM | Admit: 2017-02-10 | Discharge: 2017-02-12 | DRG: 064 | Disposition: A | Discharge status: Hospice | Payer: Medicare Other | Attending: Neurology | Admitting: Neurology

## 2017-02-10 ENCOUNTER — Inpatient Hospital Stay (HOSPITAL_COMMUNITY): Payer: Medicare Other

## 2017-02-10 DIAGNOSIS — Z4659 Encounter for fitting and adjustment of other gastrointestinal appliance and device: Secondary | ICD-10-CM

## 2017-02-10 DIAGNOSIS — I609 Nontraumatic subarachnoid hemorrhage, unspecified: Principal | ICD-10-CM | POA: Diagnosis present

## 2017-02-10 DIAGNOSIS — R1310 Dysphagia, unspecified: Secondary | ICD-10-CM

## 2017-02-10 DIAGNOSIS — G8250 Quadriplegia, unspecified: Secondary | ICD-10-CM | POA: Diagnosis present

## 2017-02-10 DIAGNOSIS — R29716 NIHSS score 16: Secondary | ICD-10-CM | POA: Diagnosis present

## 2017-02-10 DIAGNOSIS — E039 Hypothyroidism, unspecified: Secondary | ICD-10-CM | POA: Diagnosis present

## 2017-02-10 DIAGNOSIS — I10 Essential (primary) hypertension: Secondary | ICD-10-CM | POA: Diagnosis present

## 2017-02-10 DIAGNOSIS — Z79899 Other long term (current) drug therapy: Secondary | ICD-10-CM | POA: Diagnosis not present

## 2017-02-10 DIAGNOSIS — Z87891 Personal history of nicotine dependence: Secondary | ICD-10-CM

## 2017-02-10 DIAGNOSIS — Z7189 Other specified counseling: Secondary | ICD-10-CM

## 2017-02-10 DIAGNOSIS — Z8546 Personal history of malignant neoplasm of prostate: Secondary | ICD-10-CM | POA: Diagnosis not present

## 2017-02-10 DIAGNOSIS — W19XXXA Unspecified fall, initial encounter: Secondary | ICD-10-CM

## 2017-02-10 DIAGNOSIS — G301 Alzheimer's disease with late onset: Secondary | ICD-10-CM | POA: Diagnosis present

## 2017-02-10 DIAGNOSIS — S066X0A Traumatic subarachnoid hemorrhage without loss of consciousness, initial encounter: Secondary | ICD-10-CM | POA: Diagnosis not present

## 2017-02-10 DIAGNOSIS — Z66 Do not resuscitate: Secondary | ICD-10-CM | POA: Diagnosis present

## 2017-02-10 DIAGNOSIS — R0902 Hypoxemia: Secondary | ICD-10-CM

## 2017-02-10 DIAGNOSIS — Z515 Encounter for palliative care: Secondary | ICD-10-CM | POA: Diagnosis not present

## 2017-02-10 DIAGNOSIS — F0280 Dementia in other diseases classified elsewhere without behavioral disturbance: Secondary | ICD-10-CM | POA: Diagnosis present

## 2017-02-10 DIAGNOSIS — K219 Gastro-esophageal reflux disease without esophagitis: Secondary | ICD-10-CM | POA: Diagnosis present

## 2017-02-10 DIAGNOSIS — G8191 Hemiplegia, unspecified affecting right dominant side: Secondary | ICD-10-CM | POA: Diagnosis present

## 2017-02-10 DIAGNOSIS — T68XXXA Hypothermia, initial encounter: Secondary | ICD-10-CM

## 2017-02-10 DIAGNOSIS — I444 Left anterior fascicular block: Secondary | ICD-10-CM | POA: Diagnosis present

## 2017-02-10 DIAGNOSIS — E785 Hyperlipidemia, unspecified: Secondary | ICD-10-CM | POA: Diagnosis present

## 2017-02-10 DIAGNOSIS — S066XAA Traumatic subarachnoid hemorrhage with loss of consciousness status unknown, initial encounter: Secondary | ICD-10-CM | POA: Diagnosis present

## 2017-02-10 DIAGNOSIS — H3530 Unspecified macular degeneration: Secondary | ICD-10-CM | POA: Diagnosis present

## 2017-02-10 DIAGNOSIS — I639 Cerebral infarction, unspecified: Secondary | ICD-10-CM

## 2017-02-10 DIAGNOSIS — R7989 Other specified abnormal findings of blood chemistry: Secondary | ICD-10-CM | POA: Diagnosis present

## 2017-02-10 DIAGNOSIS — Z7982 Long term (current) use of aspirin: Secondary | ICD-10-CM

## 2017-02-10 DIAGNOSIS — M810 Age-related osteoporosis without current pathological fracture: Secondary | ICD-10-CM | POA: Diagnosis present

## 2017-02-10 DIAGNOSIS — S066X9A Traumatic subarachnoid hemorrhage with loss of consciousness of unspecified duration, initial encounter: Secondary | ICD-10-CM | POA: Diagnosis present

## 2017-02-10 DIAGNOSIS — R531 Weakness: Secondary | ICD-10-CM | POA: Diagnosis present

## 2017-02-10 LAB — RAPID URINE DRUG SCREEN, HOSP PERFORMED
Amphetamines: NOT DETECTED
BARBITURATES: NOT DETECTED
Benzodiazepines: NOT DETECTED
Cocaine: NOT DETECTED
Opiates: NOT DETECTED
TETRAHYDROCANNABINOL: NOT DETECTED

## 2017-02-10 LAB — I-STAT CHEM 8, ED
BUN: 37 mg/dL — AB (ref 6–20)
CREATININE: 1.5 mg/dL — AB (ref 0.61–1.24)
Calcium, Ion: 0.93 mmol/L — ABNORMAL LOW (ref 1.15–1.40)
Chloride: 110 mmol/L (ref 101–111)
Glucose, Bld: 197 mg/dL — ABNORMAL HIGH (ref 65–99)
HEMATOCRIT: 48 % (ref 39.0–52.0)
HEMOGLOBIN: 16.3 g/dL (ref 13.0–17.0)
POTASSIUM: 4.1 mmol/L (ref 3.5–5.1)
SODIUM: 143 mmol/L (ref 135–145)
TCO2: 21 mmol/L (ref 0–100)

## 2017-02-10 LAB — APTT: aPTT: 37 seconds — ABNORMAL HIGH (ref 24–36)

## 2017-02-10 LAB — DIFFERENTIAL
BASOS ABS: 0.2 10*3/uL — AB (ref 0.0–0.1)
Basophils Relative: 1 %
EOS ABS: 0 10*3/uL (ref 0.0–0.7)
Eosinophils Relative: 0 %
LYMPHS PCT: 5 %
Lymphs Abs: 1 10*3/uL (ref 0.7–4.0)
Monocytes Absolute: 0.4 10*3/uL (ref 0.1–1.0)
Monocytes Relative: 2 %
NEUTROS PCT: 92 %
Neutro Abs: 18.1 10*3/uL — ABNORMAL HIGH (ref 1.7–7.7)

## 2017-02-10 LAB — COMPREHENSIVE METABOLIC PANEL
ALT: 21 U/L (ref 17–63)
AST: 26 U/L (ref 15–41)
Albumin: 3.2 g/dL — ABNORMAL LOW (ref 3.5–5.0)
Alkaline Phosphatase: 91 U/L (ref 38–126)
Anion gap: 9 (ref 5–15)
BUN: 37 mg/dL — AB (ref 6–20)
CHLORIDE: 110 mmol/L (ref 101–111)
CO2: 20 mmol/L — AB (ref 22–32)
CREATININE: 1.57 mg/dL — AB (ref 0.61–1.24)
Calcium: 7.8 mg/dL — ABNORMAL LOW (ref 8.9–10.3)
GFR calc Af Amer: 42 mL/min — ABNORMAL LOW (ref >60)
GFR calc non Af Amer: 37 mL/min — ABNORMAL LOW (ref >60)
Glucose, Bld: 194 mg/dL — ABNORMAL HIGH (ref 65–99)
Potassium: 4.2 mmol/L (ref 3.5–5.1)
SODIUM: 139 mmol/L (ref 135–145)
Total Bilirubin: 0.6 mg/dL (ref 0.3–1.2)
Total Protein: 5.6 g/dL — ABNORMAL LOW (ref 6.5–8.1)

## 2017-02-10 LAB — CBC
HCT: 44.9 % (ref 39.0–52.0)
Hemoglobin: 12.5 g/dL — ABNORMAL LOW (ref 13.0–17.0)
MCH: 18.7 pg — ABNORMAL LOW (ref 26.0–34.0)
MCHC: 27.8 g/dL — ABNORMAL LOW (ref 30.0–36.0)
MCV: 67 fL — ABNORMAL LOW (ref 78.0–100.0)
PLATELETS: 1045 10*3/uL — AB (ref 150–400)
RBC: 6.7 MIL/uL — AB (ref 4.22–5.81)
RDW: 21.6 % — ABNORMAL HIGH (ref 11.5–15.5)
WBC: 19.7 10*3/uL — AB (ref 4.0–10.5)

## 2017-02-10 LAB — PROTIME-INR
INR: 1.31
PROTHROMBIN TIME: 16.4 s — AB (ref 11.4–15.2)

## 2017-02-10 LAB — CBG MONITORING, ED: Glucose-Capillary: 188 mg/dL — ABNORMAL HIGH (ref 65–99)

## 2017-02-10 LAB — I-STAT TROPONIN, ED: Troponin i, poc: 0 ng/mL (ref 0.00–0.08)

## 2017-02-10 MED ORDER — NIMODIPINE 30 MG PO CAPS
60.0000 mg | ORAL_CAPSULE | ORAL | Status: DC
  Administered 2017-02-10 – 2017-02-11 (×2): 60 mg via ORAL
  Filled 2017-02-10 (×2): qty 2

## 2017-02-10 MED ORDER — MEMANTINE HCL ER 28 MG PO CP24
28.0000 mg | ORAL_CAPSULE | Freq: Every day | ORAL | Status: DC
  Filled 2017-02-10 (×2): qty 1

## 2017-02-10 MED ORDER — ORAL CARE MOUTH RINSE
15.0000 mL | Freq: Two times a day (BID) | OROMUCOSAL | Status: DC
  Administered 2017-02-11 (×2): 15 mL via OROMUCOSAL

## 2017-02-10 MED ORDER — ONDANSETRON 4 MG PO TBDP: 4.0000 mg | ORAL_TABLET | Freq: Four times a day (QID) | ORAL | Status: DC | PRN

## 2017-02-10 MED ORDER — DOCUSATE SODIUM 100 MG PO CAPS
100.0000 mg | ORAL_CAPSULE | Freq: Two times a day (BID) | ORAL | Status: DC
  Administered 2017-02-10: 100 mg via ORAL
  Filled 2017-02-10: qty 1

## 2017-02-10 MED ORDER — NIMODIPINE 60 MG/20ML PO SOLN
60.0000 mg | ORAL | Status: DC
  Administered 2017-02-11 (×3): 60 mg
  Filled 2017-02-10 (×4): qty 20

## 2017-02-10 MED ORDER — PANTOPRAZOLE SODIUM 40 MG PO TBEC
40.0000 mg | DELAYED_RELEASE_TABLET | Freq: Every day | ORAL | Status: DC
  Administered 2017-02-10: 40 mg via ORAL
  Filled 2017-02-10: qty 1

## 2017-02-10 MED ORDER — GADOBENATE DIMEGLUMINE 529 MG/ML IV SOLN
10.0000 mL | Freq: Once | INTRAVENOUS | Status: AC
  Administered 2017-02-10: 10 mL via INTRAVENOUS

## 2017-02-10 MED ORDER — LEVOTHYROXINE SODIUM 88 MCG PO TABS
88.0000 ug | ORAL_TABLET | Freq: Every day | ORAL | Status: DC
  Filled 2017-02-10: qty 1

## 2017-02-10 MED ORDER — PANTOPRAZOLE SODIUM 40 MG PO PACK
40.0000 mg | PACK | Freq: Every day | ORAL | Status: DC
  Administered 2017-02-11: 40 mg
  Filled 2017-02-10: qty 20

## 2017-02-10 MED ORDER — IOPAMIDOL (ISOVUE-370) INJECTION 76%
INTRAVENOUS | Status: AC
  Administered 2017-02-10: 50 mL
  Filled 2017-02-10: qty 50

## 2017-02-10 MED ORDER — ACETAMINOPHEN 650 MG RE SUPP: 650.0000 mg | RECTAL | Status: DC | PRN

## 2017-02-10 MED ORDER — LABETALOL HCL 5 MG/ML IV SOLN
20.0000 mg | Freq: Once | INTRAVENOUS | Status: AC
  Administered 2017-02-10: 20 mg via INTRAVENOUS
  Filled 2017-02-10: qty 4

## 2017-02-10 MED ORDER — ACETAMINOPHEN 325 MG PO TABS: 650.0000 mg | ORAL_TABLET | ORAL | Status: DC | PRN

## 2017-02-10 MED ORDER — STROKE: EARLY STAGES OF RECOVERY BOOK
Freq: Once | Status: DC
  Filled 2017-02-10: qty 1

## 2017-02-10 MED ORDER — GALANTAMINE HYDROBROMIDE 4 MG PO TABS
12.0000 mg | ORAL_TABLET | Freq: Two times a day (BID) | ORAL | Status: DC
  Filled 2017-02-10 (×4): qty 3

## 2017-02-10 MED ORDER — MORPHINE SULFATE (PF) 4 MG/ML IV SOLN
4.0000 mg | Freq: Once | INTRAVENOUS | Status: AC
  Administered 2017-02-10: 4 mg via INTRAVENOUS
  Filled 2017-02-10: qty 1

## 2017-02-10 MED ORDER — NICARDIPINE HCL IN NACL 20-0.86 MG/200ML-% IV SOLN
3.0000 mg/h | INTRAVENOUS | Status: DC | PRN
  Administered 2017-02-10: 3 mg/h via INTRAVENOUS
  Administered 2017-02-11: 5 mg/h via INTRAVENOUS
  Filled 2017-02-10 (×2): qty 200

## 2017-02-10 MED ORDER — SODIUM CHLORIDE 0.9 % IV SOLN
INTRAVENOUS | Status: DC
  Administered 2017-02-10: 19:00:00 via INTRAVENOUS

## 2017-02-10 MED ORDER — ONDANSETRON HCL 4 MG/2ML IJ SOLN: 4.0000 mg | Freq: Four times a day (QID) | INTRAMUSCULAR | Status: DC | PRN

## 2017-02-10 MED ORDER — ACETAMINOPHEN 160 MG/5ML PO SOLN: 650.0000 mg | ORAL | Status: DC | PRN

## 2017-02-10 NOTE — Progress Notes (Signed)
eLink Physician-Brief Progress Note Patient Name: Billy Banks. DOB: [birth year] MRN: [id number]   Date of Service  02/10/2017  HPI/Events of Note  Oliguria - Bladder scan with 480 mL.   eICU Interventions  Will place Foley Catheter now.      Intervention Category Intermediate Interventions: Oliguria - evaluation and management  Sommer,Steven Eugene 02/10/2017, 11:54 PM

## 2017-02-10 NOTE — Consult Note (Signed)
Chief Complaint   Chief Complaint  Patient presents with  . Code Stroke    HPI   HPI: Billy Banks. is a 83 y.o. male who was brought to ER via EMS due to code stroke. Unable to obtain history from patient due to dementia.  Last known normal 1245. Daughter present in room giving history. Was doing well this morning. Roughly 1245 told daughter not feeling well. She then realized he couldn't move RUE. Called 911 and at that time he was unable to move RLE. Arrived to ER and unable to move extremities. On asa 81mg , otherwise no anti-coagulants. Was complaining of a HA prior to not feeling well. No syncope. No bowel/bladder incontinence. No fall/injury.    PMH: Past Medical History:  Diagnosis Date  . Adult hypothyroidism 06/24/2015  . Alzheimer disease   . Bright red blood per rectum 06/24/2015  . Cancer [facility])    prostate  . Essential hypertension 05/02/2015  . Excessive falling 08/08/2014  . GERD (gastroesophageal reflux disease)   . H/O malignant neoplasm of prostate 06/24/2015  . History of small bowel obstruction 11/17/2013  . History of small bowel obstruction 11/17/2013  . HLD (hyperlipidemia) 06/24/2015  . Hyperlipidemia   . Hypertension   . Late onset Alzheimer's disease without behavioral disturbance 05/02/2015  . Left anterior fascicular block 06/24/2015  . Macular degeneration   . Osteoporosis 06/24/2015  . Spinal stenosis of lumbar region   . Thyroid disease   . Weakness 06/23/2015    PSH: Past Surgical History:  Procedure Laterality Date  . EYE SURGERY    . knee replacement surgery Bilateral   . PROSTATECTOMY      SH: Social History  Substance Use Topics  . Smoking status: Former Research scientist (life sciences)  . Smokeless tobacco: Never Used  . Alcohol use No    MEDS: Prior to Admission medications   Medication Sig Start Date End Date Taking? Authorizing Provider  amLODipine (NORVASC) 5 MG tablet Take 5 mg by mouth at bedtime.    Yes [provider]  aspirin 81 MG  tablet Take 81 mg by mouth every evening.    Yes [provider]  galantamine (RAZADYNE) 12 MG tablet TAKE 1 TABLET BY MOUTH TWICE A DAY Patient taking differently: Take 12 mg by mouth two times a day 06/18/16  Yes Jaffe, Adam R, DO  levothyroxine (SYNTHROID, LEVOTHROID) 88 MCG tablet Take 88 mcg by mouth daily before breakfast.   Yes [provider]  memantine (NAMENDA XR) 28 MG CP24 24 hr capsule Take 1 capsule (28 mg total) by mouth daily. Patient taking differently: Take 28 mg by mouth at bedtime.  08/06/16  Yes Jaffe, Adam R, DO  Multiple Vitamins-Minerals (PRESERVISION AREDS 2 PO) Take 1 tablet by mouth 2 (two) times daily.   Yes [provider]  Omega-3 1000 MG CAPS Take 1,000 mg by mouth 2 (two) times daily.   Yes [provider]  acetaminophen (TYLENOL) 325 MG tablet Take 2 tablets (650 mg total) by mouth every 6 (six) hours as needed for mild pain (or Fever >/= 101). Patient not taking: Reported on 02/10/2017 05/06/16   Velvet Bathe, MD    ALLERGY: No Known Allergies  ROS: unable to obtain due to dementia ROS  Vitals:   02/10/17 1445 02/10/17 1500  BP: 119/63 129/68  Pulse: 66 69  Resp: (!) 25 (!) 22  Temp:     Elderly male laying comfortably in bed Awake. Not oriented. PERRL Does not answer  questions Does not move extremities willingly. When asked to move, just says he cannot.  Does flex at hip and knee bilaterally to painful stimuli.  CN exam limited. Appears grossly intact  IMAGING: CT HEAD IMPRESSION: 1. Acute subarachnoid hemorrhage in the cervical spinal canal and basilar cisterns. There is more blood than typically seen with a perimesencephalic hemorrhage. This may represent aneurysmal hemorrhage. CTA head and neck recommended for further evaluation  CT ANGIO HEAD/NECK  IMPRESSION: No cause for subarachnoid hemorrhage. In particular, no aneurysm or vascular malformation to account for cervical and posterior  fossa subarachnoid blood.  Diffuse atherosclerotic disease. Both carotids are patent with atherosclerotic calcification the bifurcation and the cavernous segment  Segmental occlusion of the right vertebral artery between C1 and C3. Left vertebral artery widely patent with mild stenosis at the origin  No intracranial large vessel occlusion or aneurysm.  CT C Spine IMPRESSION: No acute cervical spine injury.  Moderate spondylosis of the cervical spinal multilevel disc disease and multilevel neural foraminal narrowing.  IMPRESSION/PLAN 83 y.o. male with acute SAH in cervical spinal canal. Poor neuro exam. CT angio without evidence of aneurysm or vascular malformation.  CT C spine without acute cervical spinal injury. No clear cause for cause of acute SAH. Will obtain MRA C spine. No acute NS intervention indicated. Will continue to follow.

## 2017-02-10 NOTE — ED Triage Notes (Signed)
LSN 12:45p, per daughter pt had just went to bathroom using walker, sat down in recliner and daughter noticed pt slumped to right side.  Pt unable to move extremities.  C/o headache after pt had sat down in chair.

## 2017-02-10 NOTE — H&P (Addendum)
Neurology H&P Reason for Consult: Code stroke Referring Physician: Venora Maples, K  CC: Stroke  History is obtained from: Family   HPI: Billy Banks. is a 83 y.o. male with a history of dementia who was in his normal state of health when he walked to his chair, he developed acute right-sided weakness at that time. EMS was called and en route he developed left-sided weakness as well. On arrival, his mental status was intact but he was completely plegic.   LKW: 12:45 PM TPA given: no, sah   ROS: A 14 point ROS was performed and is negative except as noted in the HPI.       Past Medical History:  Diagnosis Date  . Adult hypothyroidism 06/24/2015  . Alzheimer disease   . Bright red blood per rectum 06/24/2015  . Cancer [facility])    prostate  . Essential hypertension 05/02/2015  . Excessive falling 08/08/2014  . GERD (gastroesophageal reflux disease)   . H/O malignant neoplasm of prostate 06/24/2015  . History of small bowel obstruction 11/17/2013  . History of small bowel obstruction 11/17/2013  . HLD (hyperlipidemia) 06/24/2015  . Hyperlipidemia   . Hypertension   . Late onset Alzheimer's disease without behavioral disturbance 05/02/2015  . Left anterior fascicular block 06/24/2015  . Macular degeneration   . Osteoporosis 06/24/2015  . Spinal stenosis of lumbar region   . Thyroid disease   . Weakness 06/23/2015          Family History  Problem Relation Age of Onset  . Adopted: Yes     Social History:  reports that he has quit smoking. He has never used smokeless tobacco. He reports that he does not drink alcohol or use drugs.   Exam: Current vital signs: BP (!) 165/89   Pulse 78   Temp (!) 93.7 F (34.3 C) (Rectal)   Resp (!) 25   Ht 5\' 7"  (1.702 m)   Wt 84.1 kg (185 lb 4.8 oz)   SpO2 96%   BMI 29.02 kg/m  Vital signs in last 24 hours: Temp:  [93.7 F (34.3 C)] 93.7 F (34.3 C) (07/22 1442) Pulse Rate:  [54-78] 78 (07/22 1600) Resp:   [22-29] 25 (07/22 1600) BP: (101-165)/(61-89) 165/89 (07/22 1600) SpO2:  [91 %-97 %] 96 % (07/22 1600) Weight:  [84.1 kg (185 lb 4.8 oz)] 84.1 kg (185 lb 4.8 oz) (07/22 1502)   Physical Exam  Constitutional: Appears well-developed and well-nourished.  Psych: Affect appropriate to situation Eyes: No scleral injection HENT: No OP obstrucion Head: Normocephalic.  Cardiovascular: Normal rate and regular rhythm.  Respiratory: Effort normal and breath sounds normal to anterior ascultation GI: Soft.  No distension. There is no tenderness.  Skin: WDI  Neuro: Mental Status: Patient is awake, alert, he has his most questions with head shakes or nods, is able to give his name, not oriented to month. Cranial Nerves: II: Visual Fields are full. Pupils are equal, round, and reactive to light.   III,IV, VI: EOMI without ptosis or diploplia.  V: Facial sensation is symmetric to temperature VII: Facial movement is symmetric.  VIII: hearing is intact to voice X: Uvula elevates symmetrically XI: Shoulder shrug is symmetric. XII: tongue is midline without atrophy or fasciculations.  Motor: Flaccid quadriplegia Sensory: He denies sensation anywhere Plantars: Toes are upgoing bilaterally Cerebellar: Unable to perform     I have reviewed labs in epic and the results pertinent to this consultation are: Creatinine 1.5  I have reviewed the images  obtained: CT head-large volume subarachnoid hemorrhage predominately affecting the posterior fossa which is also surround spinal cord  Impression: 83 year old male with subarachnoid hemorrhage. His mental status was good, but he had a severe focal neurological deficit which I think categorizes him as a Hunt and Hess grade 4. Given the location and volume of blood I wonder about a spinal AVM.   Recommendations: 1) SBP goal less than 160 2) CTA to assess for aneurysms 3) neurosurgical consultation, will defer to neurosurgery for further  management of SAH 4) hold antiplatelets 5) the passes stroke swallow screening, then will resume Namenda and galantamine 6) cardene for BP 7) Thrombocytosis - chronic, will monitor, not a candidate for anti-platelets at this time.   This patient is critically ill and at significant risk of neurological worsening, death and care requires constant monitoring of vital signs, hemodynamics,respiratory and cardiac monitoring, neurological assessment, discussion with family, other specialists and medical decision making of high complexity. I spent 40 minutes of neurocritical care time  in the care of  this patient.  Roland Rack, MD [facility] [phone]  If 7pm- 7am, please page neurology on call as listed in [place]. 02/10/2017  5:15 PM

## 2017-02-10 NOTE — ED Notes (Signed)
Dr. Venora Maples made aware of change in pt status.  See 8110 stroke neuro check note.

## 2017-02-10 NOTE — ED Notes (Signed)
Pt going straight from MRI to upstairs with Angela Nevin RN

## 2017-02-10 NOTE — Progress Notes (Signed)
Code stroke called at 1339, patient arrived to [facility] via [place] at 1351.  As per EMS was on commode and family noted him to be slumped to the side, LSN 1245  On arrival patient is flaccid bilaterally uppers and lowers.  NIHSS 16

## 2017-02-10 NOTE — Consult Note (Deleted)
Neurology Consultation Reason for Consult: Code stroke Referring Physician: Venora Maples, K  CC: Stroke  History is obtained from: Family   HPI: Billy Banks. is a 83 y.o. male with a history of dementia who was in his normal state of health when he walked to his chair, he developed acute right-sided weakness at that time. EMS was called and en route he developed left-sided weakness as well. On arrival, his mental status was intact but he was completely plegic.   LKW: 12:45 PM TPA given: no, sah   ROS: A 14 point ROS was performed and is negative except as noted in the HPI.   Past Medical History:  Diagnosis Date  . Adult hypothyroidism 06/24/2015  . Alzheimer disease   . Bright red blood per rectum 06/24/2015  . Cancer [facility])    prostate  . Essential hypertension 05/02/2015  . Excessive falling 08/08/2014  . GERD (gastroesophageal reflux disease)   . H/O malignant neoplasm of prostate 06/24/2015  . History of small bowel obstruction 11/17/2013  . History of small bowel obstruction 11/17/2013  . HLD (hyperlipidemia) 06/24/2015  . Hyperlipidemia   . Hypertension   . Late onset Alzheimer's disease without behavioral disturbance 05/02/2015  . Left anterior fascicular block 06/24/2015  . Macular degeneration   . Osteoporosis 06/24/2015  . Spinal stenosis of lumbar region   . Thyroid disease   . Weakness 06/23/2015     Family History  Problem Relation Age of Onset  . Adopted: Yes     Social History:  reports that he has quit smoking. He has never used smokeless tobacco. He reports that he does not drink alcohol or use drugs.   Exam: Current vital signs: BP (!) 165/89   Pulse 78   Temp (!) 93.7 F (34.3 C) (Rectal)   Resp (!) 25   Ht 5\' 7"  (1.702 m)   Wt 84.1 kg (185 lb 4.8 oz)   SpO2 96%   BMI 29.02 kg/m  Vital signs in last 24 hours: Temp:  [93.7 F (34.3 C)] 93.7 F (34.3 C) (07/22 1442) Pulse Rate:  [54-78] 78 (07/22 1600) Resp:  [22-29] 25 (07/22 1600) BP:  (101-165)/(61-89) 165/89 (07/22 1600) SpO2:  [91 %-97 %] 96 % (07/22 1600) Weight:  [84.1 kg (185 lb 4.8 oz)] 84.1 kg (185 lb 4.8 oz) (07/22 1502)   Physical Exam  Constitutional: Appears well-developed and well-nourished.  Psych: Affect appropriate to situation Eyes: No scleral injection HENT: No OP obstrucion Head: Normocephalic.  Cardiovascular: Normal rate and regular rhythm.  Respiratory: Effort normal and breath sounds normal to anterior ascultation GI: Soft.  No distension. There is no tenderness.  Skin: WDI  Neuro: Mental Status: Patient is awake, alert, he has his most questions with head shakes or nods, is able to give his name, not oriented to month. Cranial Nerves: II: Visual Fields are full. Pupils are equal, round, and reactive to light.   III,IV, VI: EOMI without ptosis or diploplia.  V: Facial sensation is symmetric to temperature VII: Facial movement is symmetric.  VIII: hearing is intact to voice X: Uvula elevates symmetrically XI: Shoulder shrug is symmetric. XII: tongue is midline without atrophy or fasciculations.  Motor: Flaccid quadriplegia Sensory: He denies sensation anywhere Plantars: Toes are upgoing bilaterally Cerebellar: Unable to perform     I have reviewed labs in epic and the results pertinent to this consultation are: Creatinine 1.5  I have reviewed the images obtained: CT head-large volume subarachnoid hemorrhage predominately affecting the  posterior fossa which is also surround spinal cord  Impression: 83 year old male with subarachnoid hemorrhage.  Recommendations: 1) SBP goal less than 160 2) CTA to assess for aneurysms 3) neurosurgical consultation, will defer to neurosurgery for further management of [place]   Roland Rack, MD [facility] [phone]  If 7pm- 7am, please page neurology on call as listed in [place].

## 2017-02-10 NOTE — ED Provider Notes (Signed)
[facility] DEPT Provider Note   CSN: [id number] Arrival date & time: 02/10/17  1351     History   Chief Complaint No chief complaint on file.   HPI Billy Banks is a 83 y.o. male.  HPI Patient is a 83 year old male who is not on chronic anticoagulants he reports that the patient developed acute onset weakness of his right upper and right lower extremity.  He was brought to the emergency department as a code stroke.  He complained of headache to the daughter.  On arrival to the emergency department he is weak in both his bilateral upper and lower extremities.  He reports ongoing headache at this time.  No history of headaches.  No recent injury or fall.  No trauma to his neck.  Denies neck pain.  Hypothermic on arrival with a temp of 93.7.  He does have mild dementia   Past Medical History:  Diagnosis Date  . Adult hypothyroidism 06/24/2015  . Alzheimer disease   . Bright red blood per rectum 06/24/2015  . Cancer [facility])    prostate  . Essential hypertension 05/02/2015  . Excessive falling 08/08/2014  . GERD (gastroesophageal reflux disease)   . H/O malignant neoplasm of prostate 06/24/2015  . History of small bowel obstruction 11/17/2013  . History of small bowel obstruction 11/17/2013  . HLD (hyperlipidemia) 06/24/2015  . Hyperlipidemia   . Hypertension   . Late onset Alzheimer's disease without behavioral disturbance 05/02/2015  . Left anterior fascicular block 06/24/2015  . Macular degeneration   . Osteoporosis 06/24/2015  . Spinal stenosis of lumbar region   . Thyroid disease   . Weakness 06/23/2015    Patient Active Problem List   Diagnosis Date Noted  . Effusion of left knee 05/05/2016  . Elevated serum creatinine 12/25/2015  . Dehydration 12/25/2015  . Diarrhea 12/25/2015  . Leukocytosis 12/25/2015  . Bright red blood per rectum 06/24/2015  . Osteoporosis 06/24/2015  . Left anterior fascicular block 06/24/2015  . H/O malignant neoplasm of prostate 06/24/2015   . Adult hypothyroidism 06/24/2015  . HLD (hyperlipidemia) 06/24/2015  . Spinal stenosis of lumbar region   . Weakness 06/23/2015  . Late onset Alzheimer's disease without behavioral disturbance 05/02/2015  . Essential hypertension 05/02/2015  . Excessive falling 08/08/2014  . History of small bowel obstruction 11/17/2013    Past Surgical History:  Procedure Laterality Date  . EYE SURGERY    . knee replacement surgery Bilateral   . PROSTATECTOMY         Home Medications    Prior to Admission medications   Medication Sig Start Date End Date Taking? Authorizing Provider  acetaminophen (TYLENOL) 325 MG tablet Take 2 tablets (650 mg total) by mouth every 6 (six) hours as needed for mild pain (or Fever >/= 101). 05/06/16   Velvet Bathe, MD  amLODipine (NORVASC) 5 MG tablet Take 5 mg by mouth daily.    [provider]  aspirin 81 MG tablet Take 81 mg by mouth daily.    [provider]  fluticasone (FLONASE) 50 MCG/ACT nasal spray Place 1 spray into both nostrils daily as needed for allergies or rhinitis.    [provider]  galantamine (RAZADYNE) 12 MG tablet TAKE 1 TABLET BY MOUTH TWICE A DAY 06/18/16   Jaffe, Adam R, DO  levothyroxine (SYNTHROID, LEVOTHROID) 88 MCG tablet Take 88 mcg by mouth daily before breakfast.    [provider]  memantine (NAMENDA XR) 28 MG CP24 24 hr capsule Take 1  capsule (28 mg total) by mouth daily. 08/06/16   Pieter Partridge, DO  Multiple Vitamins-Minerals (PRESERVISION AREDS 2 PO) Take 1 tablet by mouth 2 (two) times daily.    [provider]  NAPROXEN EX Apply 1 application topically daily as needed (arthritis pain).    [provider]  Omega-3 Fatty Acids (FISH OIL PO) Take 1,000 mg by mouth 2 (two) times daily.     [provider]    Family History Family History  Problem Relation Age of Onset  . Adopted: Yes    Social History Social History  Substance Use Topics  . Smoking status:  Former Research scientist (life sciences)  . Smokeless tobacco: Never Used  . Alcohol use No     Allergies   Patient has no known allergies.   Review of Systems Review of Systems  All other systems reviewed and are negative.    Physical Exam Updated Vital Signs There were no vitals taken for this visit.  Physical Exam  Constitutional: He appears well-developed and well-nourished.  HENT:  Head: Normocephalic and atraumatic.  Eyes: EOM are normal.  Neck: Normal range of motion.  Cardiovascular: Normal rate, regular rhythm and normal heart sounds.   Pulmonary/Chest: Effort normal and breath sounds normal. No respiratory distress.  Abdominal: Soft. He exhibits no distension. There is no tenderness.  Musculoskeletal: Normal range of motion.  Full range of motion of bilateral shoulders, elbows and wrists. Full range of motion of bilateral hips, knees and ankles.    Neurological: He is alert.  0 out of 5 strength of his bilateral upper lower extremities.  Sensation to touch present on the left upper and left lower extremity.  Can determine sensation across his abdomen and chest.  Skin: Skin is warm and dry.  Psychiatric: He has a normal mood and affect. Judgment normal.  Nursing note and vitals reviewed.    ED Treatments / Results  Labs (all labs ordered are listed, but only abnormal results are displayed) Labs Reviewed  PROTIME-INR - Abnormal; Notable for the following:       Result Value   Prothrombin Time 16.4 (*)    All other components within normal limits  APTT - Abnormal; Notable for the following:    aPTT 37 (*)    All other components within normal limits  CBC - Abnormal; Notable for the following:    WBC 19.7 (*)    RBC 6.70 (*)    Hemoglobin 12.5 (*)    MCV 67.0 (*)    MCH 18.7 (*)    MCHC 27.8 (*)    RDW 21.6 (*)    Platelets 1,045 (*)    All other components within normal limits  DIFFERENTIAL - Abnormal; Notable for the following:    Neutro Abs 18.1 (*)    Basophils Absolute  0.2 (*)    All other components within normal limits  COMPREHENSIVE METABOLIC PANEL - Abnormal; Notable for the following:    CO2 20 (*)    Glucose, Bld 194 (*)    BUN 37 (*)    Creatinine, Ser 1.57 (*)    Calcium 7.8 (*)    Total Protein 5.6 (*)    Albumin 3.2 (*)    GFR calc non Af Amer 37 (*)    GFR calc Af Amer 42 (*)    All other components within normal limits  CBG MONITORING, ED - Abnormal; Notable for the following:    Glucose-Capillary 188 (*)    All other components within normal  limits  I-STAT CHEM 8, ED - Abnormal; Notable for the following:    BUN 37 (*)    Creatinine, Ser 1.50 (*)    Glucose, Bld 197 (*)    Calcium, Ion 0.93 (*)    All other components within normal limits  PATHOLOGIST SMEAR REVIEW  I-STAT TROPONIN, ED    EKG  EKG Interpretation  Date/Time:  Sunday February 10 2017 14:40:16 EDT Ventricular Rate:  76 PR Interval:    QRS Duration: 91 QT Interval:  457 QTC Calculation: 476 R Axis:     Text Interpretation:  Sinus rhythm Supraventricular bigeminy Prolonged PR interval Borderline prolonged QT interval Baseline wander in lead(s) II III aVR aVF No significant change was found Confirmed by Jola Schmidt [phone]) on 02/10/2017 4:39:20 PM       Radiology Ct Angio Head W Or Wo Contrast  Result Date: 02/10/2017 CLINICAL DATA:  Stroke. Right-sided deficit. Fall. Subarachnoid hemorrhage EXAM: CT ANGIOGRAPHY HEAD AND NECK TECHNIQUE: Multidetector CT imaging of the head and neck was performed using the standard protocol during bolus administration of intravenous contrast. Multiplanar CT image reconstructions and MIPs were obtained to evaluate the vascular anatomy. Carotid stenosis measurements (when applicable) are obtained utilizing NASCET criteria, using the distal internal carotid diameter as the denominator. CONTRAST:  50 mL Isovue 370 IV COMPARISON:  CT head 02/10/2017 FINDINGS: CTA NECK FINDINGS Aortic arch: Atherosclerotic calcification throughout the aortic  arch and proximal great vessels. Right carotid system: Mild atherosclerotic disease right carotid bifurcation without significant stenosis Left carotid system: Mild atherosclerotic calcification left carotid bifurcation without significant stenosis. Vertebral arteries: Left vertebral artery dominant. Mild calcific stenosis at the origin of the left vertebral artery. Remainder of the left vertebral artery is patent to the basilar. Mild stenosis origin right vertebral artery due to calcific stenosis. Decreased flow in the right vertebral artery which occludes at approximately the C3 level and reconstitutes at the level of PICA. Skeleton: Multilevel disc degeneration and spurring. No acute skeletal lesion. Other neck: Negative for mass or adenopathy in the neck. Upper chest: Mild dependent atelectasis bilaterally. Review of the MIP images confirms the above findings CTA HEAD FINDINGS Anterior circulation: Extensive atherosclerotic calcification in the cavernous carotid bilaterally with mild stenosis bilaterally. No cavernous carotid aneurysm. Anterior and middle cerebral arteries are patent bilaterally without stenosis or branch occlusion Posterior circulation: Segmental occlusion of the right vertebral artery between C1 and C3. Reconstitution at the level of PICA. Left vertebral artery widely patent to the basilar. PICA patent bilaterally without aneurysm or vascular malformation. Basilar widely patent. Superior cerebellar and posterior cerebral artery is patent without stenosis or aneurysm. Posterior communicating artery patent bilaterally with fetal origin on the right Venous sinuses: Mild amount of contrast the venous sinuses which appear patent Anatomic variants: None Delayed phase: Not performed Review of the MIP images confirms the above findings IMPRESSION: No cause for subarachnoid hemorrhage. In particular, no aneurysm or vascular malformation to account for cervical and posterior fossa subarachnoid blood.  Diffuse atherosclerotic disease. Both carotids are patent with atherosclerotic calcification the bifurcation and the cavernous segment Segmental occlusion of the right vertebral artery between C1 and C3. Left vertebral artery widely patent with mild stenosis at the origin No intracranial large vessel occlusion or aneurysm. Electronically Signed   By: Franchot Gallo M.D.   On: 02/10/2017 15:04   Ct Angio Neck W Or Wo Contrast  Result Date: 02/10/2017 CLINICAL DATA:  Stroke. Right-sided deficit. Fall. Subarachnoid hemorrhage EXAM: CT ANGIOGRAPHY HEAD AND NECK TECHNIQUE:  Multidetector CT imaging of the head and neck was performed using the standard protocol during bolus administration of intravenous contrast. Multiplanar CT image reconstructions and MIPs were obtained to evaluate the vascular anatomy. Carotid stenosis measurements (when applicable) are obtained utilizing NASCET criteria, using the distal internal carotid diameter as the denominator. CONTRAST:  50 mL Isovue 370 IV COMPARISON:  CT head 02/10/2017 FINDINGS: CTA NECK FINDINGS Aortic arch: Atherosclerotic calcification throughout the aortic arch and proximal great vessels. Right carotid system: Mild atherosclerotic disease right carotid bifurcation without significant stenosis Left carotid system: Mild atherosclerotic calcification left carotid bifurcation without significant stenosis. Vertebral arteries: Left vertebral artery dominant. Mild calcific stenosis at the origin of the left vertebral artery. Remainder of the left vertebral artery is patent to the basilar. Mild stenosis origin right vertebral artery due to calcific stenosis. Decreased flow in the right vertebral artery which occludes at approximately the C3 level and reconstitutes at the level of PICA. Skeleton: Multilevel disc degeneration and spurring. No acute skeletal lesion. Other neck: Negative for mass or adenopathy in the neck. Upper chest: Mild dependent atelectasis bilaterally.  Review of the MIP images confirms the above findings CTA HEAD FINDINGS Anterior circulation: Extensive atherosclerotic calcification in the cavernous carotid bilaterally with mild stenosis bilaterally. No cavernous carotid aneurysm. Anterior and middle cerebral arteries are patent bilaterally without stenosis or branch occlusion Posterior circulation: Segmental occlusion of the right vertebral artery between C1 and C3. Reconstitution at the level of PICA. Left vertebral artery widely patent to the basilar. PICA patent bilaterally without aneurysm or vascular malformation. Basilar widely patent. Superior cerebellar and posterior cerebral artery is patent without stenosis or aneurysm. Posterior communicating artery patent bilaterally with fetal origin on the right Venous sinuses: Mild amount of contrast the venous sinuses which appear patent Anatomic variants: None Delayed phase: Not performed Review of the MIP images confirms the above findings IMPRESSION: No cause for subarachnoid hemorrhage. In particular, no aneurysm or vascular malformation to account for cervical and posterior fossa subarachnoid blood. Diffuse atherosclerotic disease. Both carotids are patent with atherosclerotic calcification the bifurcation and the cavernous segment Segmental occlusion of the right vertebral artery between C1 and C3. Left vertebral artery widely patent with mild stenosis at the origin No intracranial large vessel occlusion or aneurysm. Electronically Signed   By: Franchot Gallo M.D.   On: 02/10/2017 15:04   Ct C-spine No Charge  Result Date: 02/10/2017 CLINICAL DATA:  Patient found on floor with right-sided deficits. EXAM: CT CERVICAL SPINE WITHOUT CONTRAST TECHNIQUE: Multidetector CT imaging of the cervical spine was performed without intravenous contrast. Multiplanar CT image reconstructions were also generated. COMPARISON:  CTA neck today. FINDINGS: Alignment: Within normal. Skull base and vertebrae: Moderate  spondylosis throughout the cervical spine. Vertebral body heights are maintained. Atlantoaxial articulation is within normal. Mild uncovertebral joint spurring and facet arthropathy. No acute fracture or subluxation. Bilateral neural foramina narrowing at multiple levels due to adjacent bony spurring. Soft tissues and spinal canal: Within normal. Disc levels: Moderate disc space narrowing at the C3-4, C4-5, C5-6 and C6-7 levels. Upper chest: Within normal. Other: Contrast present within the vascular structures due to recent CTA. Right vertebral artery occlusion visualize as seen on recent CTA. IMPRESSION: No acute cervical spine injury. Moderate spondylosis of the cervical spinal multilevel disc disease and multilevel neural foraminal narrowing. Electronically Signed   By: Marin Olp M.D.   On: 02/10/2017 15:17   Ct Head Code Stroke W/o Cm  Result Date: 02/10/2017 CLINICAL DATA:  Code stroke.  Acute onset right-sided deficits. EXAM: CT HEAD WITHOUT CONTRAST TECHNIQUE: Contiguous axial images were obtained from the base of the skull through the vertex without intravenous contrast. COMPARISON:  MRI 06/25/2015 FINDINGS: Brain: Acute subarachnoid hemorrhage in the upper cervical canal and basilar cisterns. Subarachnoid blood is not present in the sylvian fissures or above the tentorium. Moderate amount of blood. Advanced atrophy with chronic ischemic changes in the white matter. No acute ischemic infarction. Negative for mass. Vascular: Negative for hyperdense vessel Skull: Negative Sinuses/Orbits: Prior sinus surgery. Mild mucosal edema paranasal sinuses. Prior cataract removal. No orbital mass. Other: None ASPECTS ([place] Stroke Program Early CT Score) - Ganglionic level infarction (caudate, lentiform nuclei, internal capsule, insula, M1-M3 cortex): 7 - Supraganglionic infarction (M4-M6 cortex): 3 Total score (0-10 with 10 being normal): 10 IMPRESSION: 1. Acute subarachnoid hemorrhage in the cervical spinal  canal and basilar cisterns. There is more blood than typically seen with a perimesencephalic hemorrhage. This may represent aneurysmal hemorrhage. CTA head and neck recommended for further evaluation 2. ASPECTS is 10 Images were reviewed with Dr. Leonel Ramsay at 1405 hours. Electronically Signed   By: Franchot Gallo M.D.   On: 02/10/2017 14:15    Procedures .Critical Care Performed by: Jola Schmidt Authorized by: Jola Schmidt      Total critical care time: 35 minutes Critical care time was exclusive of separately billable procedures and treating other patients. Critical care was necessary to treat or prevent imminent or life-threatening deterioration. Critical care was time spent personally by me on the following activities: development of treatment plan with patient and/or surrogate as well as nursing, discussions with consultants, evaluation of patient's response to treatment, examination of patient, obtaining history from patient or surrogate, ordering and performing treatments and interventions, ordering and review of laboratory studies, ordering and review of radiographic studies, pulse oximetry and re-evaluation of patient's condition.   Medications Ordered in ED Medications  iopamidol (ISOVUE-370) 76 % injection (50 mLs  Contrast Given 02/10/17 1418)     Initial Impression / Assessment and Plan / ED Course  I have reviewed the triage vital signs and the nursing notes.  Pertinent labs & imaging results that were available during my care of the patient were reviewed by me and considered in my medical decision making (see chart for details).     Patient regain strength in his left arm and left leg while in the emergency department.  He will need admission to the ICU for serial neurologic exams.  Neurosurgery has been consult included and is following along.  They're recommending MRI at this time.  Neurology is seeing the patient as well and is recommending close neurologic  examinations as they're concerned that he will develop hydrocephalus of the coming days and may require placement of a intraventricular drain.  Final Clinical Impressions(s) / ED Diagnoses   Final diagnoses:  SAH (subarachnoid hemorrhage) ([place])  Hypothermia, initial encounter    New Prescriptions New Prescriptions   No medications on file     Jola Schmidt, MD 02/10/17 1645

## 2017-02-10 NOTE — ED Notes (Signed)
Patient transported to MRI 

## 2017-02-10 NOTE — Progress Notes (Signed)
eLink Physician-Brief Progress Note Patient Name: Billy Banks. DOB: March 05, 1923 MRN: [id number]   Date of Service  02/10/2017  HPI/Events of Note  76 yowm with SAH presenting as quadraparesis admitted not neuro ICU  eICU Interventions  SBP goal less than 160     Intervention Category Major Interventions: Change in mental status - evaluation and management  Christinia Gully 02/10/2017, 7:48 PM

## 2017-02-10 NOTE — ED Notes (Signed)
Pt complaining of worsening headache, edp notified

## 2017-02-10 NOTE — ED Notes (Signed)
To room from MRI.

## 2017-02-11 ENCOUNTER — Inpatient Hospital Stay (HOSPITAL_COMMUNITY): Payer: Medicare Other

## 2017-02-11 ENCOUNTER — Encounter (HOSPITAL_COMMUNITY): Payer: Self-pay | Admitting: Neurology

## 2017-02-11 DIAGNOSIS — Z515 Encounter for palliative care: Secondary | ICD-10-CM

## 2017-02-11 DIAGNOSIS — R1310 Dysphagia, unspecified: Secondary | ICD-10-CM

## 2017-02-11 DIAGNOSIS — S066X0A Traumatic subarachnoid hemorrhage without loss of consciousness, initial encounter: Secondary | ICD-10-CM

## 2017-02-11 DIAGNOSIS — I609 Nontraumatic subarachnoid hemorrhage, unspecified: Secondary | ICD-10-CM

## 2017-02-11 DIAGNOSIS — Z7189 Other specified counseling: Secondary | ICD-10-CM

## 2017-02-11 LAB — MRSA PCR SCREENING: MRSA by PCR: NEGATIVE

## 2017-02-11 LAB — PATHOLOGIST SMEAR REVIEW

## 2017-02-11 MED ORDER — LABETALOL HCL 5 MG/ML IV SOLN
20.0000 mg | INTRAVENOUS | Status: DC | PRN
  Administered 2017-02-11 – 2017-02-12 (×2): 20 mg via INTRAVENOUS
  Filled 2017-02-11 (×2): qty 4

## 2017-02-11 NOTE — Progress Notes (Signed)
STROKE TEAM PROGRESS NOTE   HISTORY OF PRESENT ILLNESS (per record) HPI: Billy Banksis a 83 y.o.malewith a history of Alzheimer's dementia, prostate cancer, hypertension, hyperlipidemia, lumbar spinal stenosis, thyroid disease, and left anterior fasicular heart block, who was in his normal state and experienced sudden-onset right-sided weakness while walking to his chair.  EMS was called and en route he developed left-sided weakness as well. On arrival, his mental status was intact but his left-sided weakness had evolved into quadriplegia. LKW: 12:45 PM on 02/10/2017   Patient was not administered IV t-PA secondary to subarachnoid hemorrhage.  He was admitted to the neuro ICU for further evaluation and treatment.  Billy Banks. is a 83 y.o. male who was brought to ER via EMS due to code stroke. Unable to obtain history from patient due to dementia.  Last known normal 1245. Daughter present in room giving history. Was doing well this morning. Roughly 1245 told daughter not feeling well. She then realized he couldn't move RUE. Called 911 and at that time he was unable to move RLE. Arrived to ER and unable to move extremities. On asa 81mg , otherwise no anti-coagulants. Was complaining of a HA prior to not feeling well. No syncope. No bowel/bladder incontinence. No fall/injury.   SUBJECTIVE (INTERVAL HISTORY) His daughter (POA) is at the bedside.  The patient is awake, disoriented, and follows commands inconsistently.  After discussing goals of care today, the patient's daughter Billy Banks) is agreeable to watchful waiting for the next few days, and favors palliative care over aggressive treatment and PEG tube at this time.   OBJECTIVE Temp:  [93.7 F (34.3 C)-97.6 F (36.4 C)] 97.6 F (36.4 C) (07/23 0400) Pulse Rate:  [51-91] 68 (07/23 0415) Cardiac Rhythm: Bundle branch block (07/23 0400) Resp:  [18-30] 30 (07/23 0415) BP: (82-215)/(49-110) 142/72 (07/23 0415) SpO2:  [89 %-98 %]  98 % (07/23 0415) Weight:  [84.1 kg (185 lb 4.8 oz)-84.9 kg (187 lb 2.7 oz)] 84.9 kg (187 lb 2.7 oz) (07/22 2000)  CBC:  Recent Labs Lab 02/10/17 1357 02/10/17 1404  WBC 19.7*  --   NEUTROABS 18.1*  --   HGB 12.5* 16.3  HCT 44.9 48.0  MCV 67.0*  --   PLT 1,045*  --     Basic Metabolic Panel:  Recent Labs Lab 02/10/17 1357 02/10/17 1404  NA 139 143  K 4.2 4.1  CL 110 110  CO2 20*  --   GLUCOSE 194* 197*  BUN 37* 37*  CREATININE 1.57* 1.50*  CALCIUM 7.8*  --     Lipid Panel:    Component Value Date/Time   CHOL 125 06/24/2015 0618   TRIG 91 06/24/2015 0618   HDL 42 06/24/2015 0618   CHOLHDL 3.0 06/24/2015 0618   VLDL 18 06/24/2015 0618   LDLCALC 65 06/24/2015 0618   HgbA1c: No results found for: HGBA1C Urine Drug Screen:    Component Value Date/Time   LABOPIA NONE DETECTED 02/10/2017 1722   COCAINSCRNUR NONE DETECTED 02/10/2017 1722   LABBENZ NONE DETECTED 02/10/2017 1722   AMPHETMU NONE DETECTED 02/10/2017 [name] DETECTED 02/10/2017 1722   LABBARB NONE DETECTED 02/10/2017 1722    Alcohol Level No results found for: ETH  IMAGING  Ct Angio Neck W and Wo Contrast 02/10/2017 IMPRESSION: No cause for subarachnoid hemorrhage. In particular, no aneurysm or vascular malformation to account for cervical and posterior fossa subarachnoid blood. Diffuse atherosclerotic disease. Both carotids are patent with atherosclerotic calcification the bifurcation  and the cavernous segment Segmental occlusion of the right vertebral artery between C1 and C3. Left vertebral artery widely patent with mild stenosis at the origin No intracranial large vessel occlusion or aneurysm.  Mr Billy Banks Neck W Wo Contrast 02/10/2017 IMPRESSION: Limited utility exam. Upper neck vessels appear unremarkable.  Ct C-spine No Charge 02/10/2017 IMPRESSION: No acute cervical spine injury. Moderate spondylosis of the cervical spinal multilevel disc disease and multilevel neural foraminal  narrowing.  Dg Chest Port 1 View 02/11/2017 IMPRESSION: 1. Streaky bibasilar opacities, may be atelectasis, however given distribution cannot exclude aspiration. Probable small left pleural effusion. 2. Upper normal cardiac size with mild vascular congestion. 3.  Aortic Atherosclerosis (ICD10-I70.0).   Dg Abd Portable 1v 02/11/2017 IMPRESSION: Tip and side-port of nasogastric tube within the stomach.  Dg Abd Portable 1v 02/11/2017 IMPRESSION: Nasogastric tube side port in the distal esophagus with tip at the gastroesophageal junction. Recommend advancement by 10-15 cm.    Ct Head Code Stroke W/o Cm 02/10/2017 IMPRESSION: 1. Acute subarachnoid hemorrhage in the cervical spinal canal and basilar cisterns. There is more blood than typically seen with a perimesencephalic hemorrhage. This may represent aneurysmal hemorrhage.   TCDs 02/11/2017 Left: MCA: 29 , ACA: -22 , PCA: 19  Right: MCA: 38, ACA: -29, PCA: 25   PHYSICAL EXAM Pleasant elderly Caucasian male not in distress. . Afebrile. Head is nontraumatic. Neck is supple without bruit.    Cardiac exam no murmur or gallop. Lungs are clear to auscultation. Distal pulses are well felt. Neurological exam ;  Awake alert disoriented. Answers to name and recognizes daughter. Follows simple midline and one-step commands only. Speech is clear. Diminished attention, registration and recall. Eyes are in primary position. Can follow gaze in all directions. Blinks to threat bilaterally. Has baseline diminished visual acuity from macular degeneration. Fundi were not visualized. Face is symmetric without weakness. Tongue midline. Motor system exam shows purposeful antigravity strength on the left side. Right hemiplegia but able to move right lower and upper extremity slightly. Right plantar equivocal left downgoing. Sensation and coordination and gait cannot be tested reliably.  ASSESSMENT/PLAN Mr. Billy Banks. is a 83 y.o. male with history  ofAlzheimer's dementia, prostate cancer, hypertension, hyperlipidemia, lumbar spinal stenosis, thyroid disease, and left anterior fasicular heart block presenting with right-sided weakness. He did not receive IV t-PA due to subarachnoid hemorrhage.  Stroke:  Acute subarachnoid hemorrhage in the cervical spinal canal and basilar cisterns of  undetermined etiology.  Resultant  Initial quadriplegia but now improving to right hemiplegia  CT head: Acute subarachnoid hemorrhage in the cervical spinal canal and basilar cisterns.  MRA head: Limited utility exam. Upper neck vessels appear unremarkable  CTA head and neck: Diffuse atherosclerotic disease. No LVO or high grade stenosis.  LDL 65  HgbA1c pending   SCDs for VTE prophylaxis  Diet NPO time specified  aspirin 81 mg daily prior to admission, now on No antithrombotic   Nimodipine 60mg  Q4 per NG tube for vasospasm prophylaxis  Serial TCDs MWF to monitor for vasospasm  Patient counseled to be compliant with his antithrombotic medications  Ongoing aggressive stroke risk factor management  Therapy recommendations:   pending  Disposition:  pending  Hypertension  Stable  Goal SBP < 160  Long-term BP goal normotensive  Diabetes  HgbA1c pending, goal < 7.0  Controlled  Other Stroke Risk Factors  Advanced age  History of prostate cancer  Alzheimer's dementia  Other Active Problems  None  Hospital day # 1 I  have personally examined this patient, reviewed notes, independently viewed imaging studies, participated in medical decision making and plan of care.ROS completed by me personally and pertinent positives fully documented  I have made any additions or clarifications directly to the above note.  He presented with stuttering quadriplegia secondary to high cervical spinal cord compression from hematoma from subarachnoid hemorrhage etiology for which remains indeterminate. Suspect small AVM or venous malformation on the  cord surface. Patient's prognosis is guarded given his age and medical comorbidities. I had a long discussion with the patient's daughter who is quite realistic and wants the patient to be DO NOT RESUSCITATE but wants to pursue nonaggressive medical support over the next few days to see how he does. Recommend strict blood pressure control and close neurological monitoring. MRI was attempted but patient was not cooperative This patient is critically ill and at significant risk of neurological worsening, death and care requires constant monitoring of vital signs, hemodynamics,respiratory and cardiac monitoring, extensive review of multiple databases, frequent neurological assessment, discussion with family, other specialists and medical decision making of high complexity.I have made any additions or clarifications directly to the above note.This critical care time does not reflect procedure time, or teaching time or supervisory time of PA/NP/Med Resident etc but could involve care discussion time.  I spent 50 minutes of neurocritical care time  in the care of  this patient.      Antony Contras, MD Medical Director [facility] Pager: [phone] 02/11/2017 5:13 PM   To contact Stroke Continuity provider, please refer to [email or web address] After hours, contact General Neurology

## 2017-02-11 NOTE — Evaluation (Signed)
Clinical/Bedside Swallow Evaluation Patient Details  Name: Billy Banks. MRN: [id number] Date of Birth: 05-03-1923  Today's Date: 02/11/2017 Time: SLP Start Time (ACUTE ONLY): 60 SLP Stop Time (ACUTE ONLY): 1638 SLP Time Calculation (min) (ACUTE ONLY): 27 min  Past Medical History:  Past Medical History:  Diagnosis Date  . Adult hypothyroidism 06/24/2015  . Alzheimer disease   . Bright red blood per rectum 06/24/2015  . Cancer [facility])    prostate  . Essential hypertension 05/02/2015  . Excessive falling 08/08/2014  . GERD (gastroesophageal reflux disease)   . H/O malignant neoplasm of prostate 06/24/2015  . History of small bowel obstruction 11/17/2013  . History of small bowel obstruction 11/17/2013  . HLD (hyperlipidemia) 06/24/2015  . Hyperlipidemia   . Hypertension   . Late onset Alzheimer's disease without behavioral disturbance 05/02/2015  . Left anterior fascicular block 06/24/2015  . Macular degeneration   . Osteoporosis 06/24/2015  . Spinal stenosis of lumbar region   . Thyroid disease   . Weakness 06/23/2015   Past Surgical History:  Past Surgical History:  Procedure Laterality Date  . EYE SURGERY    . knee replacement surgery Bilateral   . PROSTATECTOMY     HPI:  Billy Banksis a 83 y.o.malewith a history of dementia who was in his normal state of health when he walked to his chair, he developed acute right-sided weakness at that time. EMS was called and en route he developed left-sided weakness as well. On arrival, his mental status was intact but he was completely plegic. CT of head with Acute subarachnoid hemorrhage in the cervical spinal canal and basilar cisterns. There is more blood than typically seen with a perimesencephalic hemorrhage. This may represent aneurysmal hemorrhage.Chest x ray with streaky bibasilar opacities, may be atelectasis, however given distribution cannot exclude aspiration.   Assessment / Plan / Recommendation Clinical  Impression  Pt presents with a suspected moderate oropharyngeal dysphagia of neurogenic etiology. CT of head revealed acute subarachnoid hemorrhage in the cervical spinal canal and basilar cisterns. Left facial droop evidenced in addition to generalized oral weakness. Pt with wet vocal quality at baseline with congested breath sounds. SLP did extensive oral care and cued for pt cough with oral expectoration exhibited x2. Ice chips administered with delayed cough and persistent wet vocal quality concerning for reduced airway protection. Recommend proceed with objective swallow testing prior to diet initiation as aspiration risk remains increased in setting of acute neurological deficits, decreased respiratory support, and cognitive decline.  MBS planned next date. Continue NPO unitl completion of MBS. Educated daughter who verbalized understanding of plan of care  SLP Visit Diagnosis: Dysphagia, unspecified (R13.10)    Aspiration Risk  Moderate aspiration risk;Severe aspiration risk    Diet Recommendation   NPO  Medication Administration: Via alternative means    Other  Recommendations Oral Care Recommendations: Oral care QID   Follow up Recommendations        Frequency and Duration min 2x/week  1 week       Prognosis Prognosis for Safe Diet Advancement: Fair Barriers to Reach Goals: Severity of deficits;Time post onset      Swallow Study   General HPI: Billy Banksis a 83 y.o.malewith a history of dementia who was in his normal state of health when he walked to his chair, he developed acute right-sided weakness at that time. EMS was called and en route he developed left-sided weakness as well. On arrival, his mental status was intact but  he was completely plegic. CT of head with Acute subarachnoid hemorrhage in the cervical spinal canal and basilar cisterns. There is more blood than typically seen with a perimesencephalic hemorrhage. This may represent aneurysmal  hemorrhage.Chest x ray with streaky bibasilar opacities, may be atelectasis, however given distribution cannot exclude aspiration. Type of Study: Bedside Swallow Evaluation Previous Swallow Assessment: none on file Diet Prior to this Study: NPO Temperature Spikes Noted: No Respiratory Status: Nasal cannula History of Recent Intubation: No Behavior/Cognition: Alert;Confused;Requires cueing Oral Cavity Assessment: Dry Oral Care Completed by SLP: Yes Oral Cavity - Dentition: Dentures, top;Dentures, bottom Vision: Impaired for self-feeding Self-Feeding Abilities: Needs assist Patient Positioning: Upright in bed Baseline Vocal Quality: Wet;Low vocal intensity Volitional Cough: Congested;Wet Volitional Swallow: Able to elicit    Oral/Motor/Sensory Function Overall Oral Motor/Sensory Function: Moderate impairment Facial ROM: Reduced left Facial Symmetry: Abnormal symmetry left Lingual ROM: Reduced left   Ice Chips Ice chips: Impaired Presentation: Spoon Oral Phase Impairments: Reduced lingual movement/coordination Oral Phase Functional Implications: Prolonged oral transit Pharyngeal Phase Impairments: Suspected delayed Swallow;Multiple swallows;Wet Vocal Quality;Cough - Delayed;Cough - Immediate   Thin Liquid Thin Liquid: Not tested    Nectar Thick Nectar Thick Liquid: Not tested   Honey Thick Honey Thick Liquid: Not tested   Puree Puree: Not tested   Solid   GO   Solid: Not tested       Arvil Chaco MA, CCC-SLP Acute Care Speech Language Pathologist    Levi Aland 02/11/2017,4:46 PM

## 2017-02-11 NOTE — Progress Notes (Signed)
Dr. Leonie Man made aware patient FAILED swallow evaluation by ST. Also made aware patient pulled NGT out. Do not replace NGT per MD. Keep patient NPO and allow him to rest tonight. Will re-evaluate in the a.m. Also made aware patient BP 157/87. PRN medication and parameters ordered. Nursing to continue to monitor.

## 2017-02-11 NOTE — Progress Notes (Signed)
PT Cancellation Note  Patient Details Name: Billy Banks. MRN: [id number] DOB: 1923-03-20   Cancelled Treatment:    Reason Eval/Treat Not Completed: Patient not medically ready. Pt with active bedrest orders. Will await increase in activity orders prior to initiating PT evaluation. Thanks!   Thelma Comp 02/11/2017, 7:47 AM   Rolinda Roan, PT, DPT Acute Rehabilitation Services Pager: [phone]

## 2017-02-11 NOTE — Progress Notes (Signed)
eLink Physician-Brief Progress Note Patient Name: Billy Banks. DOB: 1923-05-12 MRN: [id number]   Date of Service  02/11/2017  HPI/Events of Note  Patient had episode of coughing and decreased sat to 91% on 3 L/min [place] O2 after taking PO [place].   eICU Interventions  Will order: 1. Keep patient NPO as already ordered.  2. Swallow evaluation by speech in AM. 3. Portable CXR now.      Intervention Category Intermediate Interventions: Other:  Lysle Dingwall 02/11/2017, 12:47 AM

## 2017-02-11 NOTE — Progress Notes (Signed)
No acute events Awake and alert but confused Moves all extremities against gravity, L>R No evidence of vascular malformation No role for surgical intervention Recommend therapy and supportive care

## 2017-02-11 NOTE — Progress Notes (Signed)
OT Cancellation Note  Patient Details Name: Billy Banks. MRN: [id number] DOB: 03-21-1923   Cancelled Treatment:    Reason Eval/Treat Not Completed: Medical issues which prohibited therapy;Patient not medically ready. Pt with active bedrest orders. Will await increase in activity orders prior to initiating OT evaluation. Thank you for this referral!  Norman Herrlich, MS OTR/L  Pager: [place] A Simaya [place] 02/11/2017, 7:21 AM

## 2017-02-11 NOTE — Consult Note (Signed)
Consultation Note Date: 02/11/2017   Patient Name: Billy Banks.  DOB: 1923/02/16  MRN: [id number]  Age / Sex: 83 y.o., male  PCP: [facility] Referring Physician: Garvin Fila, MD  Reason for Consultation: Establishing goals of care and Psychosocial/spiritual support  HPI/Patient Profile: 83 y.o. male  admitted on 02/10/2017 with history of dementia, hypertension, hypothyroidism, lumbar stenosis and history of prostate cancer who was in his normal state of health at home/lives with daughter, when he  developed acute right-sided weakness was transported via EMS to ED.  In route he developed left-sided weakness as well.   Found to have a  SAH in cervical spinal canal.  CT angio without evidence of aneurysm or vascular malformation.  CT C spine without acute cervical spinal injury. No clear cause for cause of acute SAH.   Currently lethargic, high risk for aspiration.  Daughter expresses her "knowing" his desire for dignity and independence.    Per family he has had continued physical,  functional and cognitive decline over the past several years.   He lives with his daughter, and he  [facility].     Family faces advanced directive decisions and anticipatory care needs.   Clinical Assessment and Goals of Care:   This NP Wadie Lessen reviewed medical records, received report from team, assessed the patient and then meet at the patient's bedside along with his daughter Earley Abide  to discuss diagnosis, prognosis, GOC, EOL wishes disposition and options.  A detailed discussion was had today regarding advanced directives.  Concepts specific to code status, artifical feeding and hydration, continued IV antibiotics and rehospitalization was had.  The difference between a aggressive medical intervention path  and a palliative comfort care  path for this patient at this time was had.  Values and goals of care important to patient and family were attempted to be elicited.  MOST form introduced.  Hard Choices left for review  Concept of Hospice and Palliative Care were discussed  Natural trajectory and expectations at EOL were discussed.  Questions and concerns addressed.   Family encouraged to call with questions or concerns.  PMT will continue to support holistically.  Will f/u in the morning with family at 0900   HCPOA/ daughter tells me she is documented and will bring in paper work tomorrow.  There is a declaration of a natural death documented in chart.    SUMMARY OF RECOMMENDATIONS    Code Status/Advance Care Planning:  DNR  Palliative Prophylaxis:   Aspiration, Bowel Regimen, Delirium Protocol, Frequent Pain Assessment and Oral Care  Additional Recommendations (Limitations, Scope, Preferences):  -   Treat the treatable, watchful waiting over the next 24-48 hours. -  Speech therapy to make recommendation for safest diet -daughter is leaning toward a full comfort path if likely outcome is short of return to his baseline prior to this admission   Psycho-social/Spiritual:   Desire for further Chaplaincy support: daughter tells me his pastor  will be coming to visit   Additional Recommendations: Education on Hospice  Prognosis:   Unable to determine- depends on desire for life prolonging intervetnions  Discharge Planning: To Be Determined      Primary Diagnoses: Present on Admission: . Subarachnoid hematoma ([place])   I have reviewed the medical record, interviewed the patient and family, and examined the patient. The following aspects are pertinent.  Past Medical History:  Diagnosis Date  . Adult hypothyroidism 06/24/2015  . Alzheimer disease   . Bright red blood per rectum 06/24/2015  . Cancer [facility])    prostate  . Essential hypertension 05/02/2015  . Excessive falling 08/08/2014  . GERD  (gastroesophageal reflux disease)   . H/O malignant neoplasm of prostate 06/24/2015  . History of small bowel obstruction 11/17/2013  . History of small bowel obstruction 11/17/2013  . HLD (hyperlipidemia) 06/24/2015  . Hyperlipidemia   . Hypertension   . Late onset Alzheimer's disease without behavioral disturbance 05/02/2015  . Left anterior fascicular block 06/24/2015  . Macular degeneration   . Osteoporosis 06/24/2015  . Spinal stenosis of lumbar region   . Thyroid disease   . Weakness 06/23/2015   Social History   Social History  . Marital status: Widowed    Spouse name: N/A  . Number of children: N/A  . Years of education: N/A   Social History Main Topics  . Smoking status: Former Research scientist (life sciences)  . Smokeless tobacco: Never Used  . Alcohol use No  . Drug use: No  . Sexual activity: No   Other Topics Concern  . None   Social History Narrative   ** Merged History Encounter **       Family History  Problem Relation Age of Onset  . Adopted: Yes   Scheduled Meds: .  stroke: mapping our early stages of recovery book   Does not apply Once  . docusate sodium  100 mg Oral BID  . galantamine  12 mg Oral BID WC  . levothyroxine  88 mcg Oral QAC breakfast  . mouth rinse  15 mL Mouth Rinse BID  . memantine  28 mg Oral Daily  . niMODipine  60 mg Oral Q4H   Or  . NiMODipine  60 mg Per Tube Q4H  . pantoprazole  40 mg Oral Daily   Or  . pantoprazole sodium  40 mg Per Tube Daily   Continuous Infusions: . sodium chloride 100 mL/hr at 02/10/17 1922  . niCARDipine Stopped (02/11/17 0045)   PRN Meds:.acetaminophen **OR** acetaminophen (TYLENOL) oral liquid 160 mg/5 mL **OR** acetaminophen, niCARDipine, ondansetron **OR** ondansetron (ZOFRAN) IV Medications Prior to Admission:  Prior to Admission medications   Medication Sig Start Date End Date Taking? Authorizing Provider  amLODipine (NORVASC) 5 MG tablet Take 5 mg by mouth at bedtime.    Yes [provider]  aspirin 81 MG  tablet Take 81 mg by mouth every evening.    Yes [provider]  galantamine (RAZADYNE) 12 MG tablet TAKE 1 TABLET BY MOUTH TWICE A DAY Patient taking differently: Take 12 mg by mouth two times a day 06/18/16  Yes Jaffe, Adam R, DO  levothyroxine (SYNTHROID, LEVOTHROID) 88 MCG tablet Take 88 mcg by mouth daily before breakfast.   Yes [provider]  memantine (NAMENDA XR) 28 MG CP24 24 hr capsule Take 1 capsule (28 mg total) by mouth daily. Patient taking differently: Take 28 mg by mouth at bedtime.  08/06/16  Yes Pieter Partridge, DO  Multiple Vitamins-Minerals (PRESERVISION  AREDS 2 PO) Take 1 tablet by mouth 2 (two) times daily.   Yes [provider]  Omega-3 1000 MG CAPS Take 1,000 mg by mouth 2 (two) times daily.   Yes [provider]  acetaminophen (TYLENOL) 325 MG tablet Take 2 tablets (650 mg total) by mouth every 6 (six) hours as needed for mild pain (or Fever >/= 101). Patient not taking: Reported on 02/10/2017 05/06/16   Velvet Bathe, MD   No Known Allergies Review of Systems  Unable to perform ROS: Acuity of condition    Physical Exam  Constitutional: He appears well-developed. He appears ill.  HENT:  Mouth/Throat: Oropharynx is clear and moist.  -audible throat secretions with weak cough, unable to clear secretions   Cardiovascular: Normal rate.   Musculoskeletal:  R sided weakness > than L  Skin: Skin is warm and dry.    Vital Signs: BP (!) 148/72   Pulse 85   Temp (!) 97.2 F (36.2 C) (Axillary)   Resp (!) 25   Ht 5\' 7"  (1.702 m)   Wt 84.9 kg (187 lb 2.7 oz)   SpO2 94%   BMI 29.32 kg/m  Pain Assessment: No/denies pain   Pain Score: 10-Worst pain ever   SpO2: SpO2: 94 % O2 Device:SpO2: 94 % O2 Flow Rate: .O2 Flow Rate (L/min): 2 L/min  IO: Intake/output summary:  Intake/Output Summary (Last 24 hours) at 02/11/17 1349 Last data filed at 02/11/17 1200  Gross per 24 hour  Intake          1151.17 ml  Output              900  ml  Net           251.17 ml    LBM:   Baseline Weight: Weight: 84.1 kg (185 lb 4.8 oz) Most recent weight: Weight: 84.9 kg (187 lb 2.7 oz)      Palliative Assessment/Data: 30 % at best   Discussed with Dr Leonie Man  Time In: 1300 Time Out: 1415 Time Total: 75 minutes Greater than 50%  of this time was spent counseling and coordinating care related to the above assessment and plan.  Signed by: Wadie Lessen, NP   Please contact Palliative Medicine Team phone at [phone] for questions and concerns.  For individual provider: See Shea Evans

## 2017-02-11 NOTE — Progress Notes (Signed)
Transcranial Doppler  Date POD PCO2 HCT BP  MCA ACA PCA OPHT SIPH VERT Basilar  7/23/rs     Right  Left   29  38   -22  -29   19  25   20  23   23  20    *  *   *           Right  Left                                            Right  Left                                             Right  Left                                             Right  Left                                            Right  Left                                            Right  Left                                        MCA = Middle Cerebral Artery      OPHT = Opthalmic Artery     BASILAR = Basilar Artery   ACA = Anterior Cerebral Artery     SIPH = Carotid Siphon PCA = Posterior Cerebral Artery   VERT = Verterbral Artery                   Normal MCA = 62+\-12 ACA = 50+\-12 PCA = 42+\-23   7/23 - (*) not able to insonate vessel due to position of patient. RDS

## 2017-02-12 ENCOUNTER — Inpatient Hospital Stay (HOSPITAL_COMMUNITY): Payer: Medicare Other

## 2017-02-12 DIAGNOSIS — Z66 Do not resuscitate: Secondary | ICD-10-CM

## 2017-02-12 MED ORDER — LORAZEPAM 2 MG/ML IJ SOLN
1.0000 mg | Freq: Four times a day (QID) | INTRAMUSCULAR | Status: DC | PRN
Start: 2017-02-12 — End: 2017-02-12
  Administered 2017-02-12: 1 mg via INTRAVENOUS
  Filled 2017-02-12: qty 1

## 2017-02-12 MED ORDER — ACETAMINOPHEN 650 MG RE SUPP: 650.0000 mg | RECTAL | 0 refills | Status: AC | PRN

## 2017-02-12 MED ORDER — MORPHINE SULFATE (PF) 4 MG/ML IV SOLN: 1.0000 mg | INTRAVENOUS | Status: DC | PRN

## 2017-02-12 MED ORDER — MORPHINE SULFATE (CONCENTRATE) 10 MG/0.5ML PO SOLN
5.0000 mg | ORAL | Status: DC | PRN
  Administered 2017-02-12: 5 mg via ORAL
  Filled 2017-02-12: qty 0.5

## 2017-02-12 MED ORDER — ACETAMINOPHEN 160 MG/5ML PO SOLN: 650.0000 mg | ORAL | 0 refills | Status: AC | PRN

## 2017-02-12 MED ORDER — GLYCOPYRROLATE 0.2 MG/ML IJ SOLN
0.4000 mg | Freq: Four times a day (QID) | INTRAMUSCULAR | Status: DC
  Administered 2017-02-12: 0.4 mg via INTRAVENOUS
  Filled 2017-02-12: qty 2

## 2017-02-12 MED ORDER — MORPHINE SULFATE (CONCENTRATE) 10 MG/0.5ML PO SOLN: 5.0000 mg | ORAL | 0 refills | Status: AC | PRN

## 2017-02-12 MED ORDER — LORAZEPAM 1 MG PO TABS: 1.0000 mg | ORAL_TABLET | ORAL | 0 refills | Status: AC | PRN

## 2017-02-12 NOTE — Care Management Note (Signed)
Case Management Note  Patient Details  Name: Billy Banks. MRN: [id number] Date of Birth: Mar 28, 1923  Subjective/Objective:  Pt admitted on 02/10/17 with SAH.  PTA, pt lived at home with daughter; he goes to [facility].                      Action/Plan: Family desiring comfort measures and dc to residential Hospice facility.  Plan dc to [facility] today, per CSW arrangements.    Expected Discharge Date:  02/12/17               Expected Discharge Plan:  [place]  In-House Referral:  Clinical Social Work  Discharge planning Services  CM Consult  Post Acute Care Choice:    Choice offered to:     DME Arranged:    DME Agency:     HH Arranged:    [place] Agency:     Status of Service:  Completed, signed off  If discussed at [employer], dates discussed:    Additional Comments:  Ella Bodo, RN 02/12/2017, 2:26 PM

## 2017-02-12 NOTE — Discharge Summary (Signed)
Stroke Discharge Summary  Patient ID: Billy Banks   MRN: [id number]      DOB: 13-Jan-1923  Date of Admission: 02/10/2017 Date of Discharge: 02/12/2017  Attending Physician:  Garvin Fila, MD, Stroke MD Consultant(s):   Treatment Team:  Stroke, Md, MD pulmonary/intensive care and Neurosurgery and Palliative Patient's PCP:  [facility] [place] DIAGNOSIS: Principal Problem:   SAH (subarachnoid hemorrhage) ([place]) - No cause for subarachnoid hemorrhage. In particular, no aneurysm or vascular malformation to account for cervical and posterior fossa subarachnoid blood.  Active Problems:   Subarachnoid hematoma ([place])   Palliative care by specialist   DNR (do not resuscitate) discussion   Dysphagia   -comfort care only   Past Medical History:  Diagnosis Date  . Adult hypothyroidism 06/24/2015  . Alzheimer disease   . Bright red blood per rectum 06/24/2015  . Cancer [facility])    prostate  . Essential hypertension 05/02/2015  . Excessive falling 08/08/2014  . GERD (gastroesophageal reflux disease)   . H/O malignant neoplasm of prostate 06/24/2015  . History of small bowel obstruction 11/17/2013  . History of small bowel obstruction 11/17/2013  . HLD (hyperlipidemia) 06/24/2015  . Hyperlipidemia   . Hypertension   . Late onset Alzheimer's disease without behavioral disturbance 05/02/2015  . Left anterior fascicular block 06/24/2015  . Macular degeneration   . Osteoporosis 06/24/2015  . Spinal stenosis of lumbar region   . Thyroid disease   . Weakness 06/23/2015   Past Surgical History:  Procedure Laterality Date  . EYE SURGERY    . knee replacement surgery Bilateral   . PROSTATECTOMY      Allergies as of 02/12/2017   No Known Allergies     Medication List    STOP taking these medications   acetaminophen 325 MG tablet Commonly known as:  TYLENOL Replaced by:  acetaminophen 160 MG/5ML solution   amLODipine 5 MG tablet Commonly known as:  NORVASC    aspirin 81 MG tablet   galantamine 12 MG tablet Commonly known as:  RAZADYNE   levothyroxine 88 MCG tablet Commonly known as:  SYNTHROID, LEVOTHROID   memantine 28 MG Cp24 24 hr capsule Commonly known as:  NAMENDA XR   Omega-3 1000 MG Caps   PRESERVISION AREDS 2 PO     TAKE these medications   acetaminophen 160 MG/5ML solution Commonly known as:  TYLENOL Place 20.3 mLs (650 mg total) into feeding tube every 4 (four) hours as needed for mild pain (or temp > 37.5 C (99.5 F)). Replaces:  acetaminophen 325 MG tablet   acetaminophen 650 MG suppository Commonly known as:  TYLENOL Place 1 suppository (650 mg total) rectally every 4 (four) hours as needed for mild pain (or temp > 37.5 C (99.5 F)).   LORazepam 1 MG tablet Commonly known as:  ATIVAN Take 1 tablet (1 mg total) by mouth every 4 (four) hours as needed for anxiety.   morphine CONCENTRATE 10 MG/0.5ML Soln concentrated solution Take 0.25 mLs (5 mg total) by mouth every hour as needed for moderate pain or shortness of breath.       LABORATORY STUDIES CBC    Component Value Date/Time   WBC 19.7 (H) 02/10/2017 1357   RBC 6.70 (H) 02/10/2017 1357   HGB 16.3 02/10/2017 1404   HCT 48.0 02/10/2017 1404   PLT 1,045 (HH) 02/10/2017 1357   MCV 67.0 (L) 02/10/2017 1357   MCH 18.7 (L) 02/10/2017 1357   MCHC 27.8 (  L) 02/10/2017 1357   RDW 21.6 (H) 02/10/2017 1357   LYMPHSABS 1.0 02/10/2017 1357   MONOABS 0.4 02/10/2017 1357   EOSABS 0.0 02/10/2017 1357   BASOSABS 0.2 (H) 02/10/2017 1357   CMP    Component Value Date/Time   NA 143 02/10/2017 1404   K 4.1 02/10/2017 1404   CL 110 02/10/2017 1404   CO2 20 (L) 02/10/2017 1357   GLUCOSE 197 (H) 02/10/2017 1404   BUN 37 (H) 02/10/2017 1404   CREATININE 1.50 (H) 02/10/2017 1404   CALCIUM 7.8 (L) 02/10/2017 1357   PROT 5.6 (L) 02/10/2017 1357   ALBUMIN 3.2 (L) 02/10/2017 1357   AST 26 02/10/2017 1357   ALT 21 02/10/2017 1357   ALKPHOS 91 02/10/2017 1357   BILITOT  0.6 02/10/2017 1357   GFRNONAA 37 (L) 02/10/2017 1357   GFRAA 42 (L) 02/10/2017 1357   COAGS Lab Results  Component Value Date   INR 1.31 02/10/2017   INR 1.23 06/24/2015   Lipid Panel    Component Value Date/Time   CHOL 125 06/24/2015 0618   TRIG 91 06/24/2015 0618   HDL 42 06/24/2015 0618   CHOLHDL 3.0 06/24/2015 0618   VLDL 18 06/24/2015 0618   LDLCALC 65 06/24/2015 0618   HgbA1C No results found for: HGBA1C Urinalysis    Component Value Date/Time   COLORURINE YELLOW 12/28/2015 1300   APPEARANCEUR CLEAR 12/28/2015 1300   LABSPEC 1.017 12/28/2015 1300   PHURINE 5.0 12/28/2015 1300   GLUCOSEU NEGATIVE 12/28/2015 1300   HGBUR NEGATIVE 12/28/2015 1300   BILIRUBINUR NEGATIVE 12/28/2015 1300   KETONESUR NEGATIVE 12/28/2015 1300   PROTEINUR 30 (A) 12/28/2015 1300   NITRITE NEGATIVE 12/28/2015 1300   LEUKOCYTESUR NEGATIVE 12/28/2015 1300   Urine Drug Screen     Component Value Date/Time   LABOPIA NONE DETECTED 02/10/2017 1722   COCAINSCRNUR NONE DETECTED 02/10/2017 1722   LABBENZ NONE DETECTED 02/10/2017 1722   AMPHETMU NONE DETECTED 02/10/2017 1722   THCU NONE DETECTED 02/10/2017 1722   LABBARB NONE DETECTED 02/10/2017 1722    Alcohol Level No results found for: [facility]   SIGNIFICANT DIAGNOSTIC STUDIES Ct Angio Neck W and Wo Contrast 02/10/2017 IMPRESSION: No cause for subarachnoid hemorrhage. In particular, no aneurysm or vascular malformation to account for cervical and posterior fossa subarachnoid blood. Diffuse atherosclerotic disease. Both carotids are patent with atherosclerotic calcification the bifurcation and the cavernous segment Segmental occlusion of the right vertebral artery between C1 and C3. Left vertebral artery widely patent with mild stenosis at the origin No intracranial large vessel occlusion or aneurysm.  Mr Billy Banks Neck W Wo Contrast 02/10/2017 IMPRESSION: Limited utility exam. Upper neck vessels appear unremarkable.  Ct C-spine No  Charge 02/10/2017 IMPRESSION: No acute cervical spine injury. Moderate spondylosis of the cervical spinal multilevel disc disease and multilevel neural foraminal narrowing.  Dg Chest Port 1 View 02/11/2017 IMPRESSION: 1. Streaky bibasilar opacities, may be atelectasis, however given distribution cannot exclude aspiration. Probable small left pleural effusion. 2. Upper normal cardiac size with mild vascular congestion. 3.  Aortic Atherosclerosis (ICD10-I70.0).   Dg Abd Portable 1v 02/11/2017 IMPRESSION: Tip and side-port of nasogastric tube within the stomach.  Dg Abd Portable 1v 02/11/2017 IMPRESSION: Nasogastric tube side port in the distal esophagus with tip at the gastroesophageal junction. Recommend advancement by 10-15 cm.    Ct Head Code Stroke W/o Cm 02/10/2017 IMPRESSION: 1. Acute subarachnoid hemorrhage in the cervical spinal canal and basilar cisterns. There is more blood than typically seen with a  perimesencephalic hemorrhage. This may represent aneurysmal hemorrhage.   TCDs 02/11/2017 Left: MCA: 29 , ACA: -22 , PCA: 19  Right: MCA: 38, ACA: -29, PCA: 25    HISTORY OF PRESENT ILLNESS Billy Banksis a 83 y.o.malewith a history of Alzheimer's dementia, prostate cancer, hypertension, hyperlipidemia, lumbar spinal stenosis, thyroid disease, and left anterior fasicular heart block, who was in his normal state and experienced sudden-onset right-sided weakness while walking to his chair.  EMS was called and en route he developed left-sided weakness as well. On arrival, his mental status was intact but his left-sided weakness had evolved into quadriplegia. LKW: 12:45 PM on 02/10/2017   Patient was not administered IV t-PA secondary to subarachnoid hemorrhage.  He was admitted to the neuro ICU for further evaluation and treatment.  Billy Banksis a 83 y.o.malewho was brought to ER via EMS due to code stroke. Unable to obtain history from patient due to  dementia. Last known normal 1245. Daughter present in room giving history. Was doing well this morning. Roughly 1245 told daughter not feeling well. She then realized he couldn't move RUE. Called 911 and at that time he was unable to move RLE. Arrived to ER and unable to move extremities. On asa 81mg , otherwise no anti-coagulants. Was complaining of a HA prior to not feeling well. No syncope. No bowel/bladder incontinence. No fall/injury.    HOSPITAL COURSE Mr. Billy Purdum Erric Machnik. is a 83 y.o. male with history ofAlzheimer's dementia, prostate cancer, hypertension, hyperlipidemia, lumbar spinal stenosis, thyroid disease, and left anterior fasicular heart block presenting with right-sided weakness. He did not receive IV t-PA due to subarachnoid hemorrhage.MRI was attempted but he was uncooperative and kept moving.patient showed improvement in let hemiplegia but had persistent right hemiplegia but was unable to swallow and had persistent oral secretions. His prognosis was felt to be poor and patient woould not want feeding tube and PEG tube and nursing home placement which seemed inevitable hence daighter mad him DNR and comfort care and he was transferred to [facility]   Stroke:  Acute subarachnoid hemorrhage in the cervical spinal canal and basilar cisterns of  undetermined etiology.  Resultant  Initial quadriplegia but now improving to right hemiplegia  CT head: Acute subarachnoid hemorrhage in the cervical spinal canal and basilar cisterns.  MRA head: Limited utility exam. Upper neck vessels appear unremarkable  CTA head and neck: Diffuse atherosclerotic disease. No LVO or high grade stenosis.  LDL 65  HgbA1c pending   SCDs for VTE prophylaxis  Diet NPO time specified  aspirin 81 mg daily prior to admission, now on No antithrombotic   Nimodipine 60mg  Q4 per NG tube for vasospasm prophylaxis  Serial TCDs MWF to monitor for vasospasm  Patient counseled to be compliant with his  antithrombotic medications  Ongoing aggressive stroke risk factor management  Therapy recommendations:  None  Disposition: discharge to hospice [facility])  Hypertension  Stable              Goal SBP < 160              Long-term BP goal normotensive  Diabetes  HgbA1c pending, goal < 7.0  Controlled  Other Stroke Risk Factors  Advanced age  History of prostate cancer  Alzheimer's dementia  Other Active Problems  None  DISCHARGE EXAM Blood pressure (!) 159/83, pulse 86, temperature 98.8 F (37.1 C), temperature source Axillary, resp. rate (!) 23, height 5\' 7"  (1.702 m), weight 84.9 kg (187 lb  2.7 oz), SpO2 92 %. Pleasant elderly Caucasian male not in distress. . Afebrile. Head is nontraumatic. Neck is supple without bruit.    Cardiac exam no murmur or gallop. Lungs are clear to auscultation. Distal pulses are well felt. Neurological exam ;  Awake alert disoriented. Answers to name and recognizes daughter. Follows simple midline and one-step commands only. Speech is clear. Diminished attention, registration and recall. Eyes are in primary position. Can follow gaze in all directions. Blinks to threat bilaterally. Has baseline diminished visual acuity from macular degeneration. Fundi were not visualized. Face is symmetric without weakness. Tongue midline. Motor system exam shows purposeful antigravity strength on the left side. Right hemiplegia but able to move right lower and upper extremity slightly. Right plantar equivocal left downgoing. Sensation and coordination and gait cannot be tested reliably.   Discharge Diet   Diet NPO time specified liquids  DISCHARGE PLAN  Disposition: Discharge to Hospice [facility])  No antithrombotic for secondary stroke prevention.  9minutes were spent preparing discharge.  I have personally examined this patient, reviewed notes, independently viewed imaging studies, participated in medical decision making and plan of  care.ROS completed by me personally and pertinent positives fully documented  I have made any additions or clarifications directly to the above note.    Antony Contras, MD Medical Director [facility] Pager: [phone] 02/12/2017 1:58 PM

## 2017-02-12 NOTE — Progress Notes (Signed)
   02/12/17 1100  Clinical Encounter Type  Visited With Patient and family together;Health care provider  Visit Type Initial;Spiritual support;Critical Care  Referral From Nurse  Consult/Referral To Chaplain  Spiritual Encounters  Spiritual Needs Emotional;Grief support  Stress Factors  Patient Stress Factors None identified  Family Stress Factors Exhausted;Family relationships;Lack of caregivers;Loss    Chaplain responded to page from Financial controller. Patient is waiting on referral to hospice. Daughter is overwhelmed as only caregiver. Explored feelings of grief and relief. Talked about self care and ways to cope in the end of life hours. Provided ministry of presence, prayer, and emotional support. Fontaine Kossman L. Volanda Napoleon, MDiv

## 2017-02-12 NOTE — Progress Notes (Signed)
Daily Progress Note   Patient Name: Billy Banks.       Date: 02/12/2017 DOB: June 26, 1923  Age: 83 y.o. MRN#: [id number] Attending Physician: Garvin Fila, MD Primary Care Physician: [facility] Admit Date: 02/10/2017  Reason for Consultation/Follow-up: Establishing goals of care, Non pain symptom management, Pain control and Psychosocial/spiritual support  Subjective:  - patient continues with lethargy, unable to follow commands and has failed swallow study  -continued conversation regarding  diagnosis, prognosis, GOC, EOL wishes disposition and options.  - The difference between a aggressive medical intervention path  and a palliative comfort care path for this patient at this time was had.    - Natural trajectory and expectations at EOL were discussed.  Questions and concerns addressed.   Family encouraged to call with questions or concerns.  PMT will continue to support holistically.   -family faces advanced directive decisions and anticipatory care needs  Length of Stay: 2  Current Medications: Scheduled Meds:  .  stroke: mapping our early stages of recovery book   Does not apply Once  . glycopyrrolate  0.4 mg Intravenous QID  . mouth rinse  15 mL Mouth Rinse BID    Continuous Infusions:   PRN Meds: acetaminophen **OR** acetaminophen (TYLENOL) oral liquid 160 mg/5 mL **OR** acetaminophen, LORazepam, morphine CONCENTRATE, [DISCONTINUED] ondansetron **OR** ondansetron (ZOFRAN) IV  Physical Exam  Constitutional: He appears well-developed. He appears lethargic. He appears ill. Nasal cannula in place.  HENT:  Mouth/Throat: Oropharynx is clear and moist.  Cardiovascular: Normal rate, regular rhythm and normal heart sounds.   Pulmonary/Chest: He has  decreased breath sounds. He has rhonchi.  Neurological: He appears lethargic.  Skin: Skin is warm and dry.            Vital Signs: BP (!) 164/99   Pulse 70   Temp (!) 97.2 F (36.2 C) (Axillary)   Resp (!) 27   Ht 5\' 7"  (1.702 m)   Wt 84.9 kg (187 lb 2.7 oz)   SpO2 95%   BMI 29.32 kg/m  SpO2: SpO2: 95 % O2 Device: O2 Device: Nasal Cannula O2 Flow Rate: O2 Flow Rate (L/min): 2 L/min  Intake/output summary:  Intake/Output Summary (Last 24 hours) at 02/12/17 1016 Last data filed at 02/12/17 0600  Gross per 24 hour  Intake  200 ml  Output              900 ml  Net             -700 ml   LBM: Last BM Date: 02/11/17 Baseline Weight: Weight: 84.1 kg (185 lb 4.8 oz) Most recent weight: Weight: 84.9 kg (187 lb 2.7 oz)       Palliative Assessment/Data: 20 %     Flowsheet Rows     Most Recent Value  Intake Tab  Referral Department  Neurology  Unit at Time of Referral  ICU  Palliative Care Primary Diagnosis  Neurology  Date Notified  02/11/17  Palliative Care Type  New Palliative care  Reason for referral  Clarify Goals of Care  Date of Admission  02/10/17  Date first seen by Palliative Care  02/11/17  # of days Palliative referral response time  0 Day(s)  # of days IP prior to Palliative referral  1  Clinical Assessment  Palliative Performance Scale Score  30%  Psychosocial & Spiritual Assessment  Palliative Care Outcomes      Patient Active Problem List   Diagnosis Date Noted  . -comfort care only 02/12/2017  . Palliative care by specialist 02/11/2017  . DNR (do not resuscitate) discussion 02/11/2017  . Dysphagia   . Subarachnoid hematoma ([place]) 02/10/2017  . SAH (subarachnoid hemorrhage) ([place]) 02/10/2017  . Effusion of left knee 05/05/2016  . Elevated serum creatinine 12/25/2015  . Dehydration 12/25/2015  . Diarrhea 12/25/2015  . Leukocytosis 12/25/2015  . Bright red blood per rectum 06/24/2015  . Osteoporosis 06/24/2015  . Left anterior  fascicular block 06/24/2015  . H/O malignant neoplasm of prostate 06/24/2015  . Adult hypothyroidism 06/24/2015  . HLD (hyperlipidemia) 06/24/2015  . Spinal stenosis of lumbar region   . Weakness 06/23/2015  . Late onset Alzheimer's disease without behavioral disturbance 05/02/2015  . Essential hypertension 05/02/2015  . Excessive falling 08/08/2014  . History of small bowel obstruction 11/17/2013    Palliative Care Assessment & Plan   Patient Assessment: 83 y.o. male  admitted on 02/10/2017 with history of dementia, hypertension, hypothyroidism, lumbar stenosis and history of prostate cancer who was in his normal state of health at home/lives with daughter, when he  developed acute right-sided weakness was transported via EMS to ED.  In route he developed left-sided weakness as well.   Found to have a  SAH in cervical spinal canal.  CT angio without evidence of aneurysm or vascular malformation. CT C spine without acute cervical spinal injury. No clear cause for cause of acute SAH.   Continued to fail to thrive    Recommendations/Plan:  Shift to full comfort path- no life prolonging intervetnions  Goals of Care and Additional Recommendations:   Limitations on Scope of Treatment: Full Comfort Care   No artificial feeding or hydration now or in the futre  Code Status:    Code Status Orders        Start     Ordered   02/10/17 1710  Do not attempt resuscitation (DNR)  Continuous    Question Answer Comment  In the event of cardiac or respiratory ARREST Do not call a "code blue"   In the event of cardiac or respiratory ARREST Do not perform Intubation, CPR, defibrillation or ACLS   In the event of cardiac or respiratory ARREST Use medication by any route, position, wound care, and other measures to relive pain and suffering. May use oxygen, suction and manual treatment  of airway obstruction as needed for comfort.      02/10/17 1710    Code Status History    Date Active  Date Inactive Code Status Order ID Comments User Context   05/05/2016  5:52 PM 05/06/2016 10:14 PM DNR [id number]  Albertine Patricia, MD Inpatient   05/05/2016  4:49 PM 05/05/2016  5:52 PM DNR [id number]  Albertine Patricia, MD ED   12/25/2015  2:43 PM 12/28/2015  7:10 PM DNR [id number]  Edwin Dada, MD Inpatient   06/24/2015 12:04 AM 06/24/2015  4:49 PM Full Code [id number]  Katheren Shams, DO Inpatient    Advance Directive Documentation     Most Recent Value  Type of Advance Directive  Healthcare Power of Attorney [daughter]  Pre-existing out of facility DNR order (yellow form or pink MOST form)  -  "MOST" Form in Place?  -       Prognosis:   < 2 weeks  Discharge Planning:  Hospice facility- order written for SW to offer choice  Care plan was discussed with Family and Dr Leonie Man  Thank you for allowing the Palliative Medicine Team to assist in the care of this patient.   Time In: 0900 Time Out: 0945 Total Time 45 min Prolonged Time Billed  no       Greater than 50%  of this time was spent counseling and coordinating care related to the above assessment and plan.  Wadie Lessen, NP  Please contact Palliative Medicine Team phone at [phone] for questions and concerns.

## 2017-02-12 NOTE — Progress Notes (Signed)
PT Cancellation Note  Patient Details Name: Billy Banks. MRN: [id number] DOB: 1922/12/28   Cancelled Treatment:    Reason Eval/Treat Not Completed: Patient not medically ready. Pt with active bedrest orders. Will await increase in activity orders and monitor Palliative Notes prior to initiating PT evaluation. Thanks!   Thelma Comp 02/12/2017, 7:37 AM   Rolinda Roan, PT, DPT Acute Rehabilitation Services Pager: [phone]

## 2017-02-12 NOTE — Progress Notes (Signed)
[facility] Liaison: RN visit   Received request from [place], [place] for family interest in [place] with request for transfer today. Chart reviewed. Met with daugher Benjamine Mola  to confirm interest and explain services. Family agreeable to transfer today. CSW aware. Registration paper work completed. Dr. Orpah Melter  assume care per family request. Please fax discharge summary to [phone]. RN please call report to [phone]. Please arrange transport for patient to arrive before as soon as possible.   Thank you.  [facility] Liaison  [phone]    All hospital liaison are now on [place].

## 2017-02-12 NOTE — Progress Notes (Signed)
SLP Cancellation Note  Patient Details Name: Billy Banks. MRN: [id number] DOB: May 17, 1923   Cancelled treatment:       Reason Eval/Treat Not Completed: Acknowledge full comfort measures by MD without further workup indicated for swallow or cognitive linguistics. SLP to sign off.   Arvil Chaco MA, CCC-SLP Acute Care Speech Language Pathologist    Levi Aland 02/12/2017, 1:01 PM

## 2017-02-12 NOTE — Clinical Social Work Note (Signed)
Clinical Social Worker facilitated patient discharge including contacting patient family and facility to confirm patient discharge plans.  Clinical information faxed to facility and family agreeable with plan.  CSW arranged ambulance transport via [facility] to [facility].  RN to call report prior to discharge.  Clinical Social Worker will sign off for now as social work intervention is no longer needed. Please consult us again if new need arises.  Jesse Adrianna Dudas, LCSW [phone] 

## 2017-02-12 NOTE — Progress Notes (Signed)
OT Cancellation Note  Patient Details Name: Billy Banks. MRN: [id number] DOB: [birth year]   Cancelled Treatment:    Reason Eval/Treat Not Completed: Medical issues which prohibited therapy. Pt with continued active bedrest orders. Will await increase in activity orders and monitor Palliative Notes prior to initiating OT evaluation.  Almon Register [phone] 02/12/2017, 7:52 AM

## 2017-02-12 NOTE — Progress Notes (Signed)
Report called to [facility]. Daughter (POA) signed AVS. Transport called. Will continue to give emotional support to the family and patient.

## 2017-02-12 NOTE — Progress Notes (Signed)
SLP Cancellation Note  Patient Details Name: Billy Banks. MRN: [id number] DOB: [birth year]   Cancelled treatment:       Reason Eval/Treat Not Completed: Patient not medically ready. Pt not managing secretions per RN, unlikely to benefit from MBS at this time. Will follow for plan of care.    Neva Ramaswamy, Katherene Ponto 02/12/2017, 9:32 AM

## 2017-02-18 ENCOUNTER — Telehealth: Payer: Self-pay

## 2017-02-18 NOTE — Telephone Encounter (Signed)
-----   Message from Garvin Fila, MD sent at 02/03/2017  5:48 PM EDT ----- Mitchell Heir inform the patient that transcranial Doppler study done on 02/11/17 was technically difficult but did not show any major blockages of the blood vessel. Mild age-related hardening of the blood vessels which is an expected finding

## 2017-02-18 NOTE — Telephone Encounter (Signed)
RN was calling patients daughter Benjamine Mola about her fathers transcranial doppler results. Pts daughter stated her father pass away. Rn offer her condolences on the phone.

## 2017-02-20 DEATH — deceased

## 2017-06-06 IMAGING — CR DG KNEE COMPLETE 4+V*L*
4 series · 4 of 4 positions shown · non-contrast
Comparison: None.

CLINICAL DATA: Pain in left knee without trauma. Knee surgery 10
years ago.

EXAM:
LEFT KNEE - COMPLETE 4+ VIEW

[x knee ap left (1 of 4)]
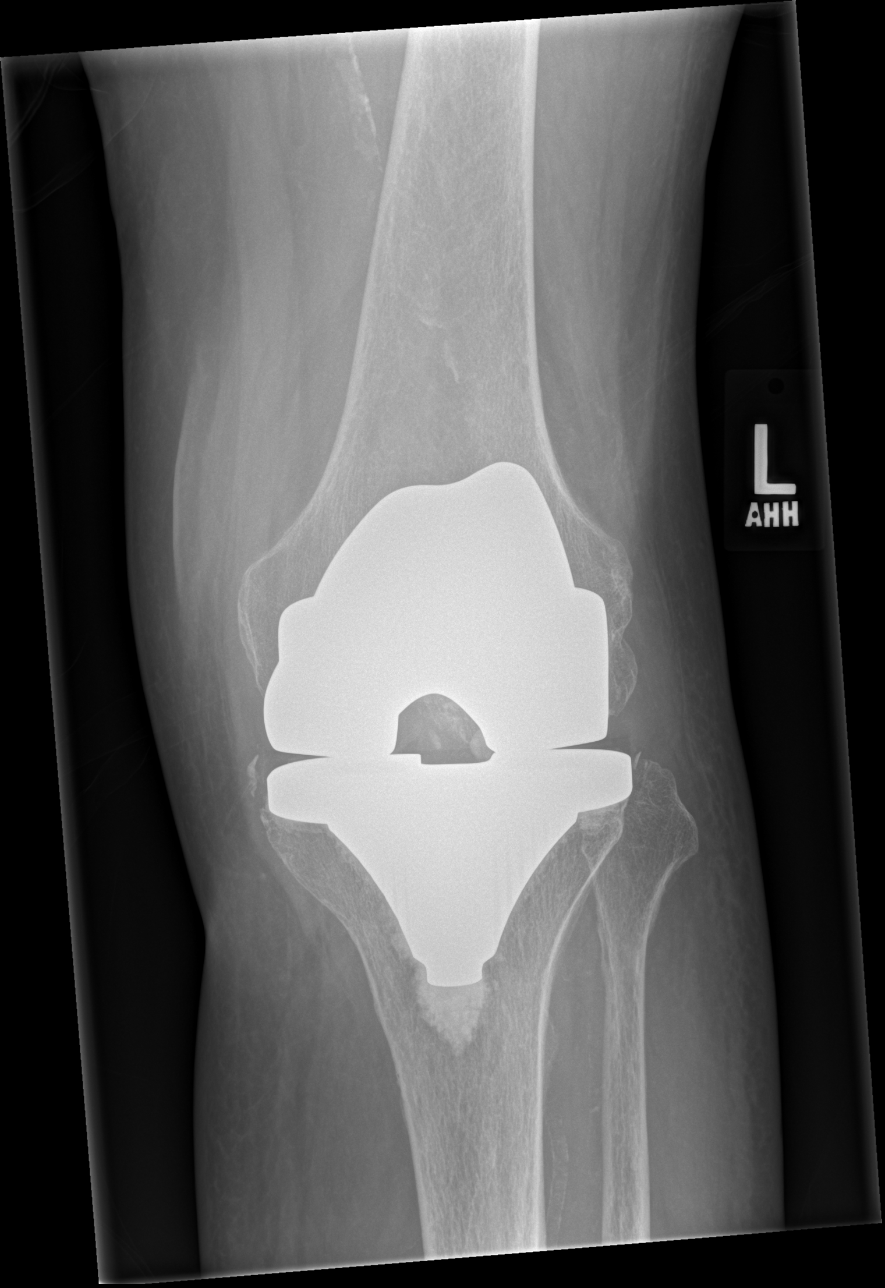

[x knee ap left (2 of 4)]
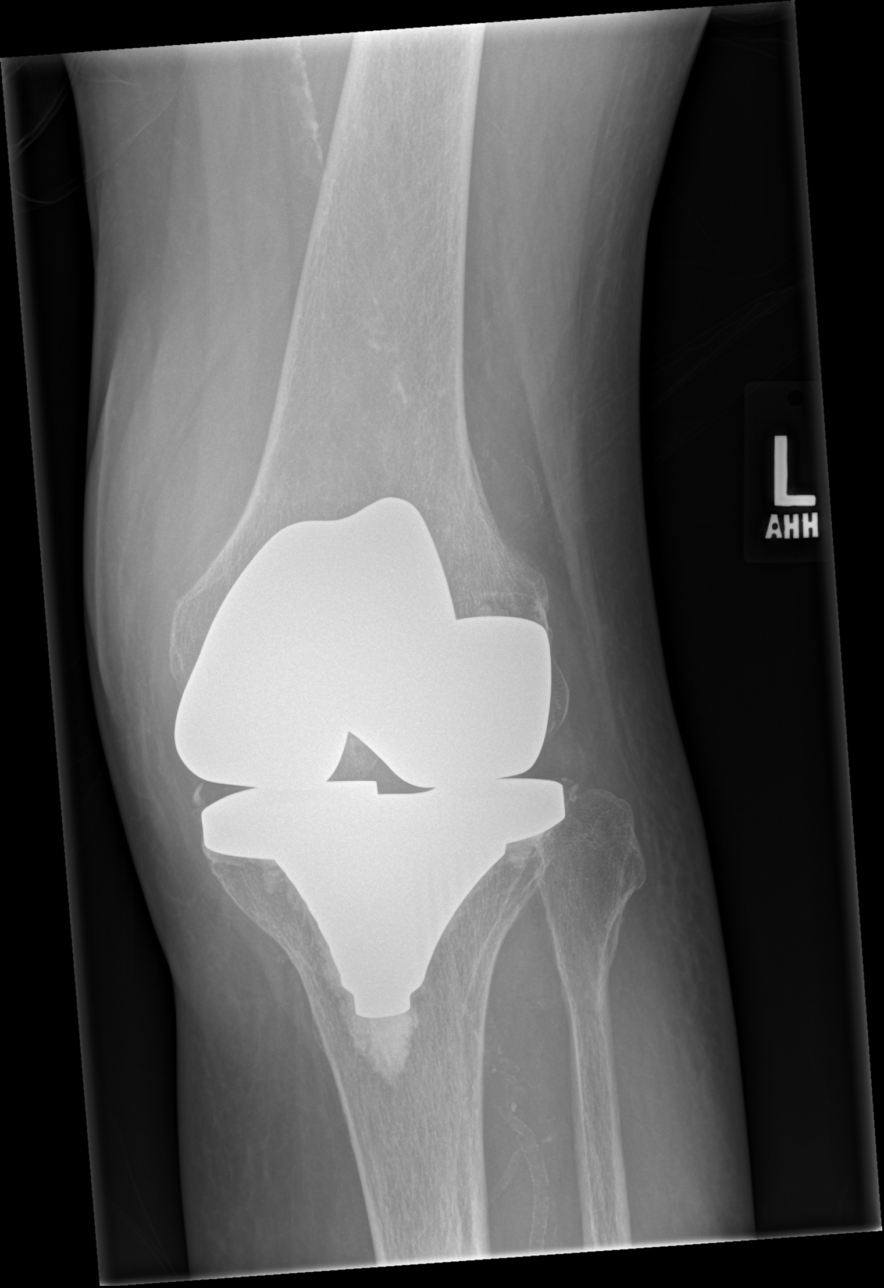

[x knee ap left (3 of 4)]
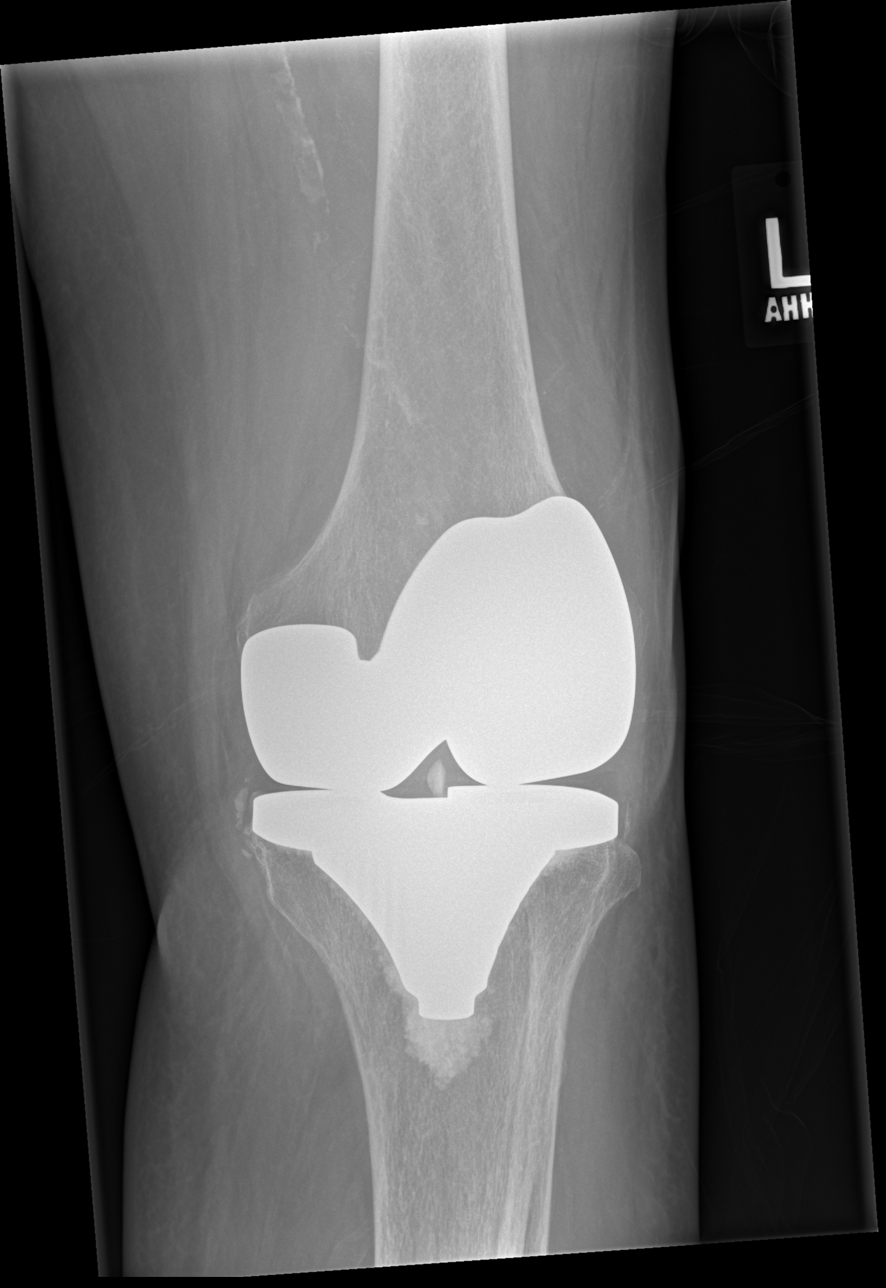

[x knee ap left (4 of 4)]
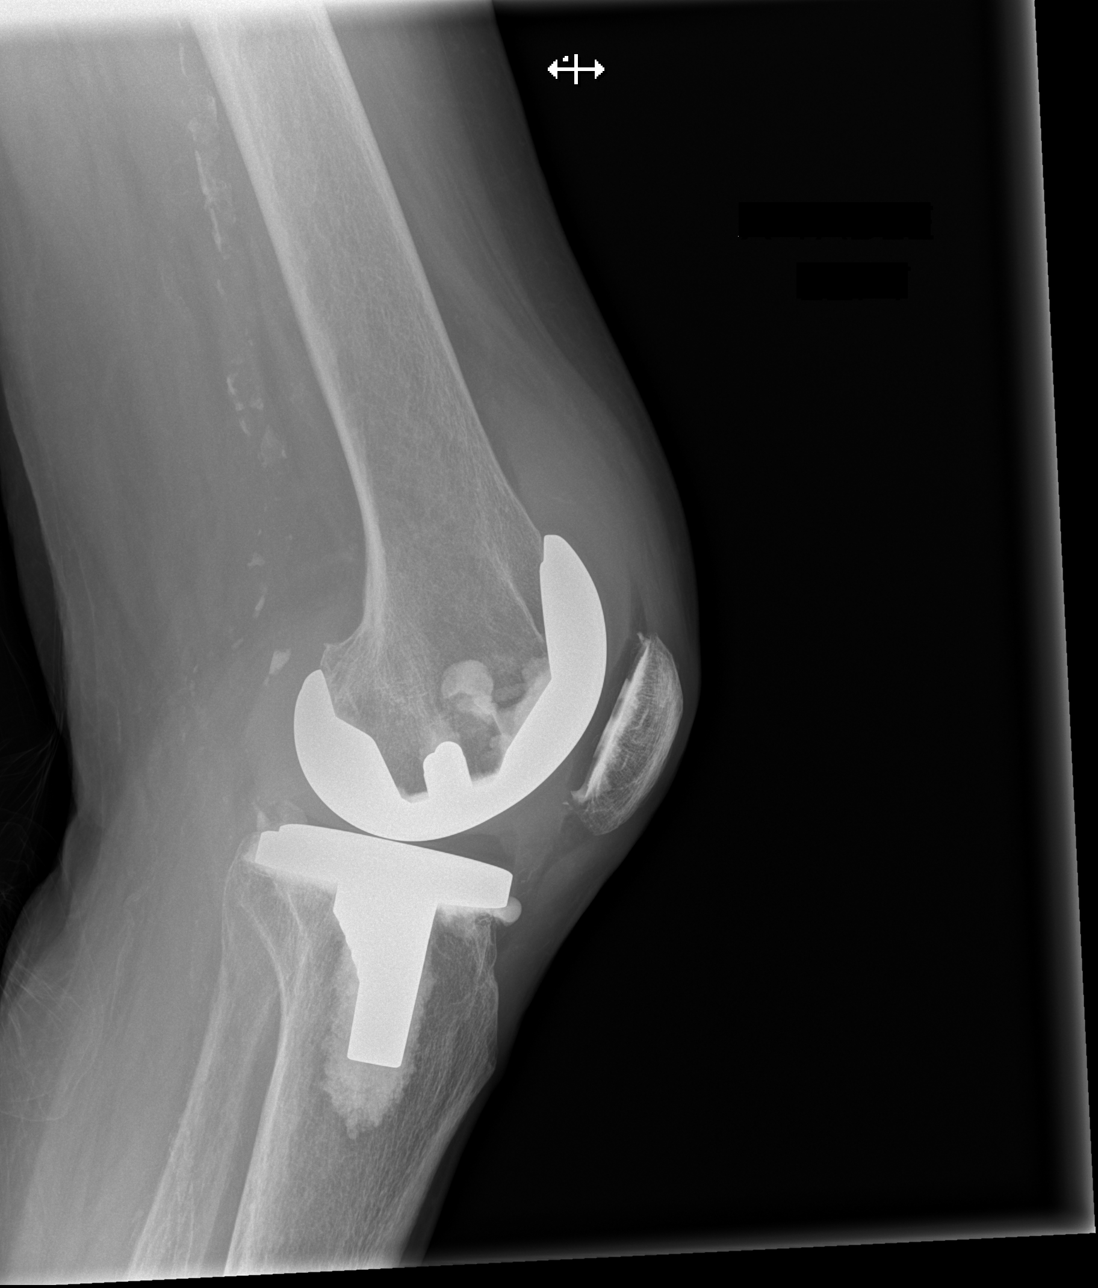

[4 of 4 positions shown; findings below may reference images not displayed]

FINDINGS: The knee replacement hardware is in good position with no evidence
of failure or loosening. No fractures. Vascular calcifications are
noted. A moderate to large joint effusion is identified.
IMPRESSION: Moderate to large joint effusion. Knee replacement. No other acute
abnormalities.

## 2017-08-05 ENCOUNTER — Ambulatory Visit: Payer: Self-pay | Admitting: Neurology

## 2018-03-14 IMAGING — CT CT CERVICAL SPINE W/O CM
3 of 5 series · 16 of 33 positions shown, 19 images · IV contrast (APPLIED)
Comparison: CTA neck today.

CLINICAL DATA: Patient found on floor with right-sided deficits.

EXAM:
CT CERVICAL SPINE WITHOUT CONTRAST
TECHNIQUE: Multidetector CT imaging of the cervical spine was performed without
intravenous contrast. Multiplanar CT image reconstructions were also
generated.

[Series 1: ortho · axial · 0.21mm/px · z∈[-315,-137]mm · 8 of 542 slices shown, 10 images]
[im 61/542  soft-tissue]
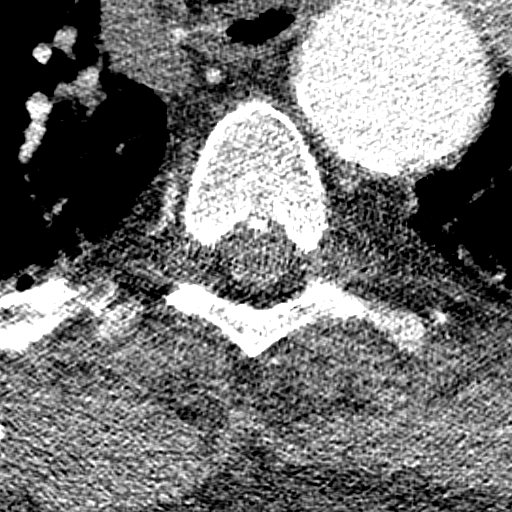
[im 61/542  bone]
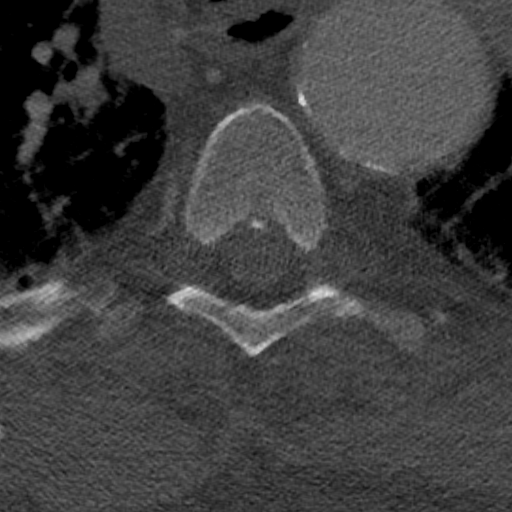
[im 121/542  bone]
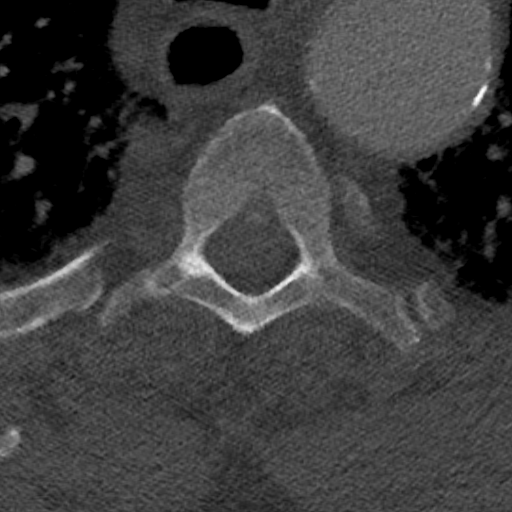
[im 181/542  bone]
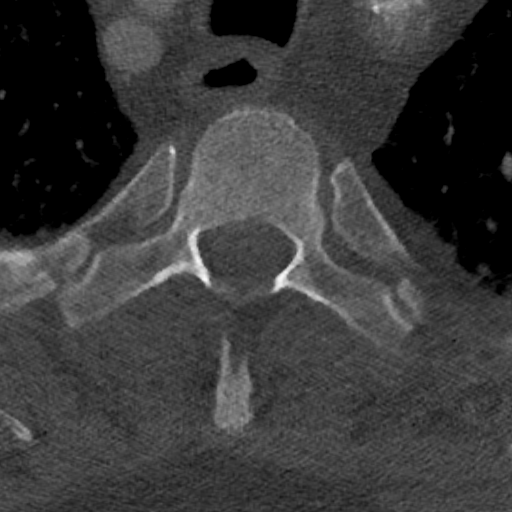
[im 241/542  bone]
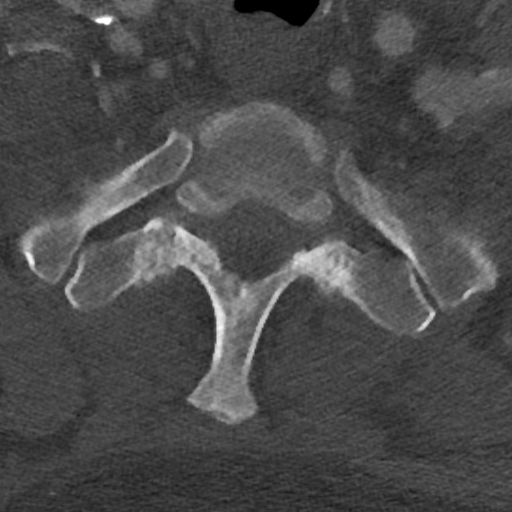
[im 301/542  soft-tissue]
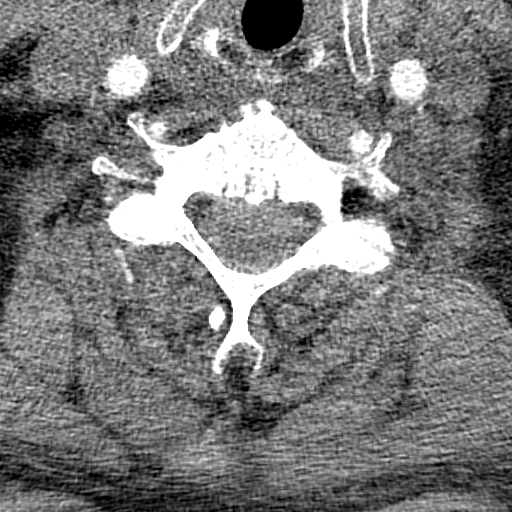
[im 301/542  bone]
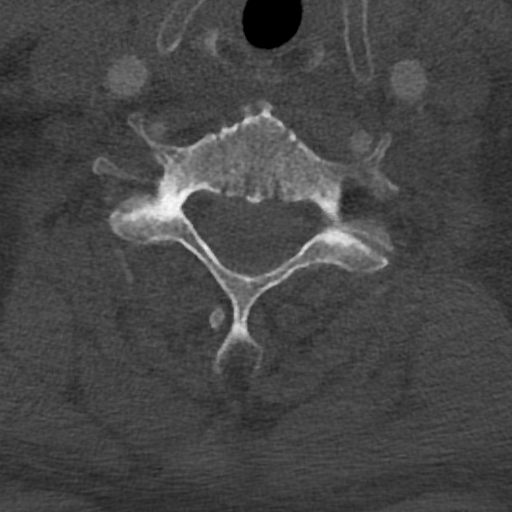
[im 361/542  bone]
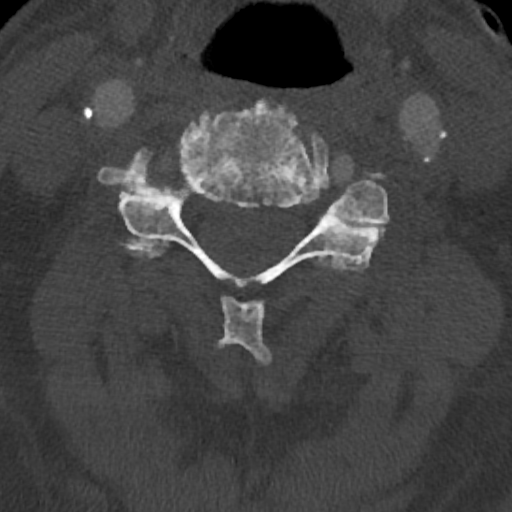
[im 421/542  bone]
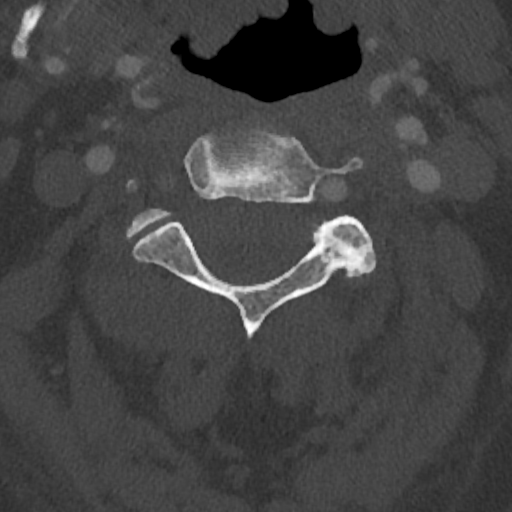
[im 481/542  bone]
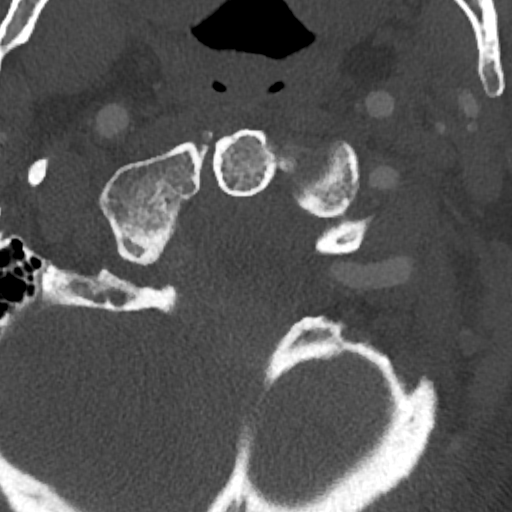

[Series 3: cor c-spine bw 2.0 · coronal · 0.40mm/px · 3 of 51 slices shown]
[im 11/51  bone]
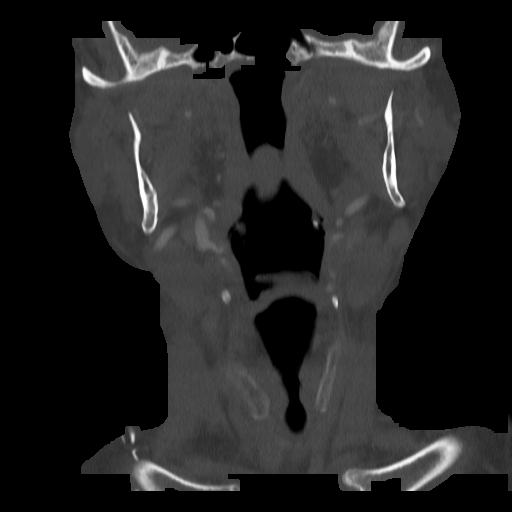
[im 21/51  bone]
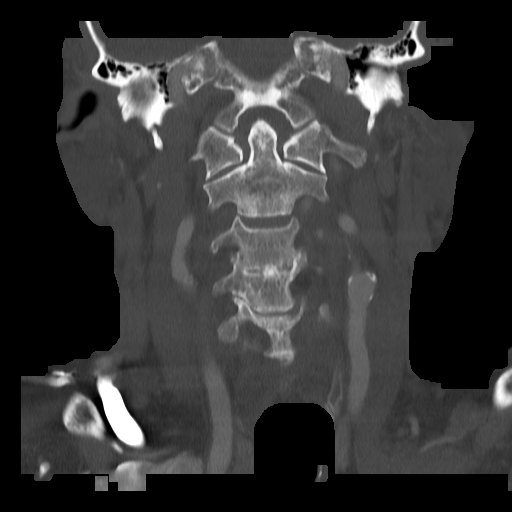
[im 31/51  bone]
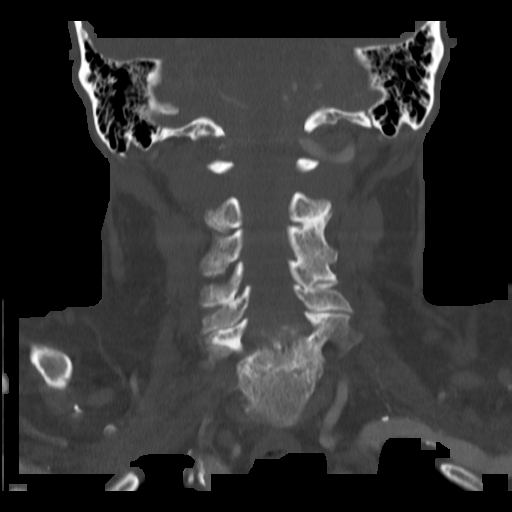

[Series 4: sag c-spine bw 2.0 · sagittal · 0.40mm/px · 5 of 51 slices shown, 6 images]
[im 17/51  bone]
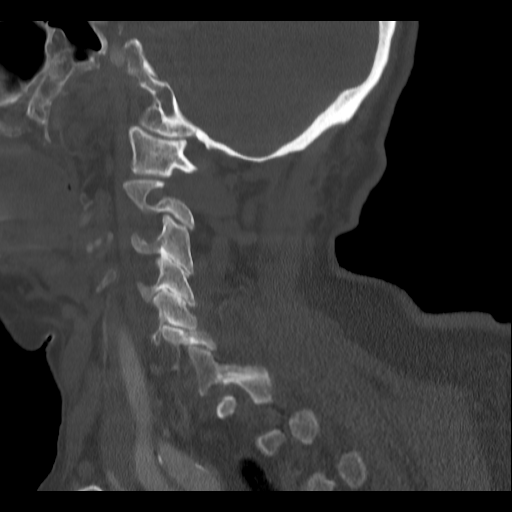
[im 21/51  bone]
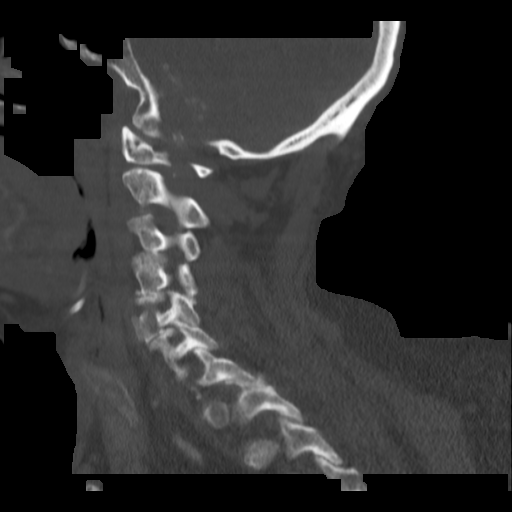
[im 26/51  soft-tissue]
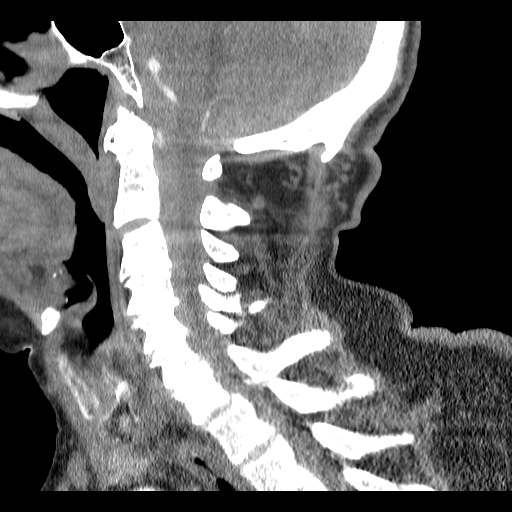
[im 26/51  bone]
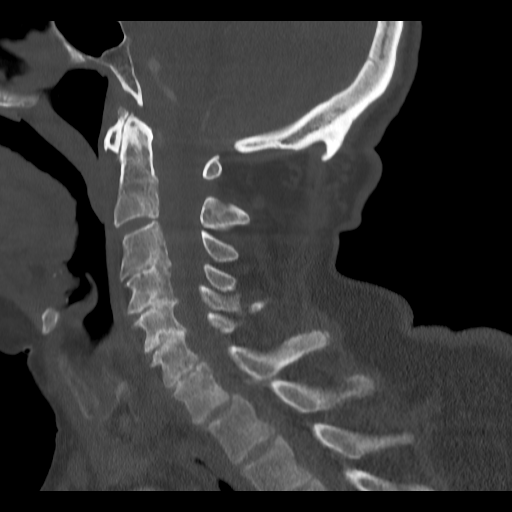
[im 30/51  bone]
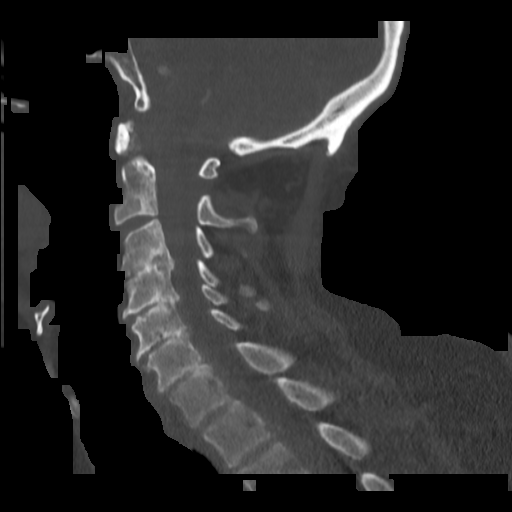
[im 34/51  bone]
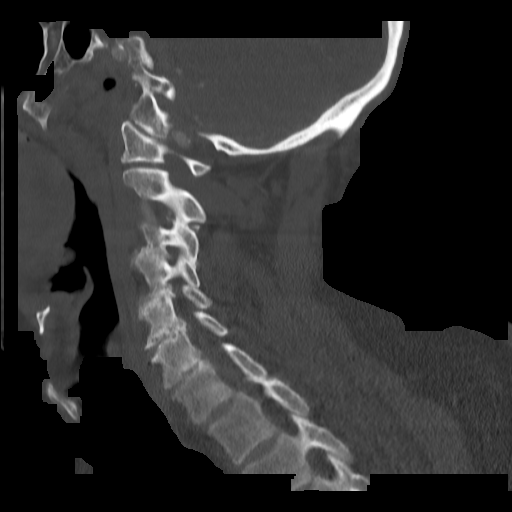

[16 of 33 positions shown; findings below may reference images not displayed]

FINDINGS: Alignment: Within normal.

Skull base and vertebrae: Moderate spondylosis throughout the
cervical spine. Vertebral body heights are maintained. Atlantoaxial
articulation is within normal. Mild uncovertebral joint spurring and
facet arthropathy. No acute fracture or subluxation. Bilateral
neural foramina narrowing at multiple levels due to adjacent bony
spurring.

Soft tissues and spinal canal: Within normal.

Disc levels: Moderate disc space narrowing at the C3-4, C4-5, C5-6
and C6-7 levels.

Upper chest: Within normal.

Other: Contrast present within the vascular structures due to recent
CTA. Right vertebral artery occlusion visualize as seen on recent
CTA.
IMPRESSION: No acute cervical spine injury.

Moderate spondylosis of the cervical spinal multilevel disc disease
and multilevel neural foraminal narrowing.
